# Patient Record
Sex: Female | Born: 1990
Health system: Southern US, Community
[De-identification: ages and names within clinical notes are randomized; demographics above are authoritative.]

## PROBLEM LIST (undated history)

## (undated) ENCOUNTER — Inpatient Hospital Stay (HOSPITAL_COMMUNITY): Payer: Self-pay

## (undated) DIAGNOSIS — F32A Depression, unspecified: Secondary | ICD-10-CM

## (undated) DIAGNOSIS — F329 Major depressive disorder, single episode, unspecified: Secondary | ICD-10-CM

## (undated) HISTORY — PX: TONSILLECTOMY: SUR1361

---
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
## 2009-03-19 ENCOUNTER — Inpatient Hospital Stay (HOSPITAL_COMMUNITY): Admission: AD | Admit: 2009-03-19 | Discharge: 2009-03-19 | Payer: Self-pay | Admitting: Obstetrics and Gynecology

## 2009-06-09 ENCOUNTER — Inpatient Hospital Stay (HOSPITAL_COMMUNITY): Admission: AD | Admit: 2009-06-09 | Discharge: 2009-06-12 | Payer: Self-pay | Admitting: Obstetrics & Gynecology

## 2010-05-13 ENCOUNTER — Emergency Department (HOSPITAL_COMMUNITY): Admission: EM | Admit: 2010-05-13 | Discharge: 2010-05-13 | Payer: Self-pay | Admitting: Emergency Medicine

## 2010-08-14 ENCOUNTER — Inpatient Hospital Stay (INDEPENDENT_AMBULATORY_CARE_PROVIDER_SITE_OTHER)
Admission: RE | Admit: 2010-08-14 | Discharge: 2010-08-14 | Disposition: A | Discharge status: Home / Self Care | Payer: Self-pay | Source: Ambulatory Visit | Attending: Emergency Medicine | Admitting: Emergency Medicine

## 2010-08-14 DIAGNOSIS — A749 Chlamydial infection, unspecified: Secondary | ICD-10-CM

## 2010-08-14 DIAGNOSIS — N390 Urinary tract infection, site not specified: Secondary | ICD-10-CM

## 2010-08-14 LAB — POCT URINALYSIS DIPSTICK
Nitrite: NEGATIVE
Protein, ur: 30 mg/dL — AB
Specific Gravity, Urine: >=1.03 (ref 1.005–1.030)
Urobilinogen, UA: 0.2 mg/dL (ref 0.0–1.0)
pH: 6 (ref 5.0–8.0)

## 2010-08-17 LAB — GC/CHLAMYDIA PROBE AMP, GENITAL: GC Probe Amp, Genital: NEGATIVE

## 2010-09-13 LAB — POCT URINALYSIS DIPSTICK
Bilirubin Urine: NEGATIVE
Ketones, ur: NEGATIVE mg/dL
Specific Gravity, Urine: 1.025 (ref 1.005–1.030)
Urobilinogen, UA: 0.2 mg/dL (ref 0.0–1.0)

## 2010-09-13 LAB — POCT PREGNANCY, URINE: Preg Test, Ur: NEGATIVE

## 2010-09-13 LAB — WET PREP, GENITAL

## 2010-10-02 ENCOUNTER — Inpatient Hospital Stay (INDEPENDENT_AMBULATORY_CARE_PROVIDER_SITE_OTHER)
Admission: RE | Admit: 2010-10-02 | Discharge: 2010-10-02 | Disposition: A | Discharge status: Home / Self Care | Payer: Self-pay | Source: Ambulatory Visit | Attending: Family Medicine | Admitting: Family Medicine

## 2010-10-02 DIAGNOSIS — N760 Acute vaginitis: Secondary | ICD-10-CM

## 2010-10-02 LAB — POCT URINALYSIS DIP (DEVICE)
Ketones, ur: NEGATIVE mg/dL
Specific Gravity, Urine: >=1.03 (ref 1.005–1.030)
Urobilinogen, UA: 0.2 mg/dL (ref 0.0–1.0)
pH: 6 (ref 5.0–8.0)

## 2010-10-02 LAB — WET PREP, GENITAL

## 2010-10-02 LAB — POCT PREGNANCY, URINE: Preg Test, Ur: NEGATIVE

## 2010-10-04 LAB — URINE CULTURE
Colony Count: NO GROWTH
Culture: NO GROWTH

## 2010-10-04 LAB — CBC
HCT: 25.8 % — ABNORMAL LOW (ref 36.0–46.0)
HCT: 30.5 % — ABNORMAL LOW (ref 36.0–46.0)
Hemoglobin: 10.1 g/dL — ABNORMAL LOW (ref 12.0–15.0)
Hemoglobin: 8.7 g/dL — ABNORMAL LOW (ref 12.0–15.0)
MCHC: 33.2 g/dL (ref 30.0–36.0)
MCHC: 33.6 g/dL (ref 30.0–36.0)
MCV: 88.5 fL (ref 78.0–100.0)
MCV: 88.8 fL (ref 78.0–100.0)
Platelets: 132 K/uL — ABNORMAL LOW (ref 150–400)
Platelets: 147 K/uL — ABNORMAL LOW (ref 150–400)
RBC: 2.9 MIL/uL — ABNORMAL LOW (ref 3.87–5.11)
RBC: 3.44 MIL/uL — ABNORMAL LOW (ref 3.87–5.11)
RDW: 13.5 % (ref 11.5–15.5)
RDW: 13.6 % (ref 11.5–15.5)
WBC: 9.4 K/uL (ref 4.0–10.5)
WBC: 9.4 K/uL (ref 4.0–10.5)

## 2010-10-04 LAB — GC/CHLAMYDIA PROBE AMP, GENITAL
Chlamydia, DNA Probe: NEGATIVE
GC Probe Amp, Genital: NEGATIVE

## 2010-10-04 LAB — RPR: RPR Ser Ql: NONREACTIVE

## 2010-10-07 LAB — GC/CHLAMYDIA PROBE AMP, GENITAL
Chlamydia, DNA Probe: NEGATIVE
GC Probe Amp, Genital: NEGATIVE

## 2010-10-07 LAB — WET PREP, GENITAL
Clue Cells Wet Prep HPF POC: NONE SEEN
Trich, Wet Prep: NONE SEEN

## 2010-10-07 LAB — URINALYSIS, ROUTINE W REFLEX MICROSCOPIC
Bilirubin Urine: NEGATIVE
Hgb urine dipstick: NEGATIVE
Protein, ur: NEGATIVE mg/dL
Specific Gravity, Urine: 1.02 (ref 1.005–1.030)
Urobilinogen, UA: 0.2 mg/dL (ref 0.0–1.0)

## 2010-10-07 LAB — CBC
HCT: 27 % — ABNORMAL LOW (ref 36.0–46.0)
MCV: 92.4 fL (ref 78.0–100.0)
WBC: 5.5 10*3/uL (ref 4.0–10.5)

## 2011-01-01 ENCOUNTER — Inpatient Hospital Stay (INDEPENDENT_AMBULATORY_CARE_PROVIDER_SITE_OTHER)
Admission: RE | Admit: 2011-01-01 | Discharge: 2011-01-01 | Disposition: A | Discharge status: Home / Self Care | Payer: Self-pay | Source: Ambulatory Visit | Attending: Emergency Medicine | Admitting: Emergency Medicine

## 2011-01-01 DIAGNOSIS — O2390 Unspecified genitourinary tract infection in pregnancy, unspecified trimester: Secondary | ICD-10-CM

## 2011-01-01 DIAGNOSIS — Z331 Pregnant state, incidental: Secondary | ICD-10-CM

## 2011-01-01 DIAGNOSIS — N760 Acute vaginitis: Secondary | ICD-10-CM

## 2011-01-01 LAB — POCT URINALYSIS DIP (DEVICE)
Protein, ur: NEGATIVE mg/dL
Specific Gravity, Urine: 1.02 (ref 1.005–1.030)
Urobilinogen, UA: 0.2 mg/dL (ref 0.0–1.0)

## 2011-01-01 LAB — POCT PREGNANCY, URINE: Preg Test, Ur: POSITIVE

## 2011-01-01 LAB — WET PREP, GENITAL

## 2011-01-02 LAB — RPR: RPR Ser Ql: NONREACTIVE

## 2011-01-02 LAB — HIV ANTIBODY (ROUTINE TESTING W REFLEX): HIV: NONREACTIVE

## 2011-01-03 LAB — GC/CHLAMYDIA PROBE AMP, GENITAL: GC Probe Amp, Genital: POSITIVE — AB

## 2011-01-17 ENCOUNTER — Inpatient Hospital Stay (HOSPITAL_COMMUNITY): Payer: Self-pay

## 2011-01-17 ENCOUNTER — Inpatient Hospital Stay (HOSPITAL_COMMUNITY)
Admission: AD | Admit: 2011-01-17 | Discharge: 2011-01-18 | Disposition: A | Discharge status: Home / Self Care | Payer: Self-pay | Source: Ambulatory Visit | Attending: Obstetrics & Gynecology | Admitting: Obstetrics & Gynecology

## 2011-01-17 ENCOUNTER — Encounter (HOSPITAL_COMMUNITY): Payer: Self-pay | Admitting: *Deleted

## 2011-01-17 DIAGNOSIS — Z3490 Encounter for supervision of normal pregnancy, unspecified, unspecified trimester: Secondary | ICD-10-CM

## 2011-01-17 DIAGNOSIS — R1032 Left lower quadrant pain: Secondary | ICD-10-CM | POA: Insufficient documentation

## 2011-01-17 DIAGNOSIS — O98819 Other maternal infectious and parasitic diseases complicating pregnancy, unspecified trimester: Secondary | ICD-10-CM | POA: Insufficient documentation

## 2011-01-17 DIAGNOSIS — A5901 Trichomonal vulvovaginitis: Secondary | ICD-10-CM | POA: Insufficient documentation

## 2011-01-17 HISTORY — DX: Depression, unspecified: F32.A

## 2011-01-17 HISTORY — DX: Major depressive disorder, single episode, unspecified: F32.9

## 2011-01-17 LAB — WET PREP, GENITAL
Clue Cells Wet Prep HPF POC: NONE SEEN
Trich, Wet Prep: NONE SEEN

## 2011-01-17 LAB — CBC
HCT: 33 % — ABNORMAL LOW (ref 36.0–46.0)
MCH: 30.2 pg (ref 26.0–34.0)
MCHC: 34.2 g/dL (ref 30.0–36.0)
MCV: 88.2 fL (ref 78.0–100.0)
RDW: 13.3 % (ref 11.5–15.5)

## 2011-01-17 LAB — URINALYSIS, ROUTINE W REFLEX MICROSCOPIC
Bilirubin Urine: NEGATIVE
Hgb urine dipstick: NEGATIVE
Ketones, ur: 15 mg/dL — AB
Protein, ur: NEGATIVE mg/dL
Urobilinogen, UA: 0.2 mg/dL (ref 0.0–1.0)

## 2011-01-17 LAB — POCT PREGNANCY, URINE: Preg Test, Ur: POSITIVE

## 2011-01-17 LAB — URINE MICROSCOPIC-ADD ON

## 2011-01-17 MED ORDER — METRONIDAZOLE 500 MG PO TABS: 500.0000 mg | ORAL_TABLET | Freq: Two times a day (BID) | ORAL | Status: AC

## 2011-01-17 MED ORDER — COMPLETENATE 29-1 MG PO CHEW: 1.0000 | CHEWABLE_TABLET | Freq: Every day | ORAL | Status: DC

## 2011-01-17 NOTE — ED Provider Notes (Signed)
Chief Complaint  Patient presents with  . Abdominal Pain   S: 20 y.o.G2P1001  byPatient's last menstrual period was 11/12/2010. presents with 1.5 month hx of intermittent LLQ abd pain.The pain is like contraction pain and radiates throughout upper and lower abd before it settles in LLQ.  Not having the pain now. Also gives 1 wk hx of brown vag discharge last noted yesterday. Seen at [facility] for this 01/01/11 and had quant of [id number], no US done. Had pos GC that date and was treated. Partner treated. Last intercourse last wk.  Denies abnormal  vaginal discharge or irritation. No dysuria or hematuria. No constipation. Has a 21 yo.   @ROS @ Past Medical History  Diagnosis Date  . Depression     O:   Filed Vitals:   01/17/11 2107  BP: 105/52  Pulse: 90  Temp: 98.2 F (36.8 C)  Resp: 18   Constitutional: WN/WD in NAD  ABD: soft, NT, no masses Pelvic:External genitalia: normal; BUS neg             Spec:White discharge, no blood.                       Cx nulliparous, no lesions, appears closed           Bimanual: Cx closed, long; no CMT                             Uterus anteverted, NT, 8-10 wk size                             Adnexae non tenderness, no masses  Results for orders placed during the hospital encounter of 01/17/11 (from the past 24 hour(s))  POCT PREGNANCY, URINE     Status: Normal   Collection Time   01/17/11  9:02 PM      Component Value Range   Preg Test, Ur POSITIVE     Results for orders placed during the hospital encounter of 01/17/11 (from the past 24 hour(s))  URINALYSIS, ROUTINE W REFLEX MICROSCOPIC     Status: Abnormal   Collection Time   01/17/11  9:01 PM      Component Value Range   Color, Urine YELLOW  YELLOW    Appearance CLEAR  CLEAR    Specific Gravity, Urine >1.030 (*) 1.005 - 1.030    pH 6.5  5.0 - 8.0    Glucose, UA NEGATIVE  NEGATIVE (mg/dL)   Hgb urine dipstick NEGATIVE  NEGATIVE    Bilirubin Urine NEGATIVE  NEGATIVE    Ketones, ur 15  (*) NEGATIVE (mg/dL)   Protein, ur NEGATIVE  NEGATIVE (mg/dL)   Urobilinogen, UA 0.2  0.0 - 1.0 (mg/dL)   Nitrite NEGATIVE  NEGATIVE    Leukocytes, UA SMALL (*) NEGATIVE   URINE MICROSCOPIC-ADD ON     Status: Abnormal   Collection Time   01/17/11  9:01 PM      Component Value Range   Squamous Epithelial / LPF RARE  RARE    WBC, UA 11-20  <3 (WBC/hpf)   RBC / HPF 0-2  <3 (RBC/hpf)   Bacteria, UA FEW (*) RARE    Urine-Other TRICHOMONAS PRESENT    POCT PREGNANCY, URINE     Status: Normal   Collection Time   01/17/11  9:02 PM      Component Value Range  Preg Test, Ur POSITIVE    CBC     Status: Abnormal   Collection Time   01/17/11  9:40 PM      Component Value Range   WBC 4.8  4.0 - 10.5 (K/uL)   RBC 3.74 (*) 3.87 - 5.11 (MIL/uL)   Hemoglobin 11.3 (*) 12.0 - 15.0 (g/dL)   HCT 16.1 (*) 09.6 - 46.0 (%)   MCV 88.2  78.0 - 100.0 (fL)   MCH 30.2  26.0 - 34.0 (pg)   MCHC 34.2  30.0 - 36.0 (g/dL)   RDW 04.5  40.9 - 81.1 (%)   Platelets 155  150 - 400 (K/uL)  ABO/RH     Status: Normal   Collection Time   01/17/11  9:40 PM      Component Value Range   ABO/RH(D) B POS    WET PREP, GENITAL     Status: Abnormal   Collection Time   01/17/11  9:48 PM      Component Value Range   Yeast, Wet Prep NONE SEEN  NONE SEEN    Trich, Wet Prep NONE SEEN  NONE SEEN    Clue Cells, Wet Prep NONE SEEN  NONE SEEN    WBC, Wet Prep HPF POC MODERATE (*) NONE SEEN      Ultrasound: *RADIOLOGY REPORT*  Clinical Data: 20 year old female with left lower quadrant  abdominal pain.  Estimated gestational age 63-week 3 days by LMP.  OBSTETRIC <14 WK ULTRASOUND  Technique: Transabdominal ultrasound was performed for evaluation  of the gestation as well as the maternal uterus and adnexal  regions.  Comparison: None relevant.  Intrauterine gestational sac: Single.  Embryo: Visible.  Cardiac Activity: Detected.  Heart Rate: 176. bpm  CRL: 3.2 cm 10w 1.d [place] EDC: 08/14/2011.  Maternal uterus/Adnexae:  No  subchorionic hemorrhage or pelvic free fluid. Normal right  ovary measuring 3.2 x 1.9 x 1.9 cm. Left ovary contains a simple  appearing cystic structure measuring 4 cm in diameter. No  associated vascularity. Left adnexa overall measures 6.1 x 3.7 x  4.8 cm.  IMPRESSION:  Viable singleton intrauterine pregnancy with estimated gestational  age of [redacted] weeks and 1 days by crown-rump length. No subchorionic  hemorrhage or pelvic free fluid.  Original Report Authenticated By: Harley Hallmark, M.D.      Imaging      A:Viable IUP 10+wk. Trich. LLQ pain of unclear etiology  P:Flagyl, PNVs, start PNC. Reassured re pain. WIll call if GC/CT pos.

## 2011-01-17 NOTE — Progress Notes (Signed)
C/o L sided lower abdominal pain for past 2 months; pain has progressively gotten worse;

## 2011-01-17 NOTE — Progress Notes (Signed)
Pt G2 P1, LMP 11/12/2010, +UPT at Urgent Care.  Pt having abd pain that comes and goes x 1.34mths.  Pain is at times greater on left side.  Pt denies bleeding.

## 2011-04-15 ENCOUNTER — Emergency Department (HOSPITAL_COMMUNITY)
Admission: EM | Admit: 2011-04-15 | Discharge: 2011-04-15 | Disposition: A | Discharge status: Home / Self Care | Payer: No Typology Code available for payment source | Attending: Emergency Medicine | Admitting: Emergency Medicine

## 2011-04-15 DIAGNOSIS — T148XXA Other injury of unspecified body region, initial encounter: Secondary | ICD-10-CM | POA: Insufficient documentation

## 2011-04-15 DIAGNOSIS — M545 Low back pain, unspecified: Secondary | ICD-10-CM | POA: Insufficient documentation

## 2011-04-15 DIAGNOSIS — M25569 Pain in unspecified knee: Secondary | ICD-10-CM | POA: Insufficient documentation

## 2011-05-30 ENCOUNTER — Encounter (HOSPITAL_COMMUNITY): Payer: Self-pay | Admitting: *Deleted

## 2011-05-30 ENCOUNTER — Emergency Department (HOSPITAL_COMMUNITY)
Admission: EM | Admit: 2011-05-30 | Discharge: 2011-05-31 | Disposition: A | Discharge status: Home / Self Care | Payer: Self-pay | Attending: Emergency Medicine | Admitting: Emergency Medicine

## 2011-05-30 DIAGNOSIS — R509 Fever, unspecified: Secondary | ICD-10-CM | POA: Insufficient documentation

## 2011-05-30 DIAGNOSIS — R42 Dizziness and giddiness: Secondary | ICD-10-CM | POA: Insufficient documentation

## 2011-05-30 DIAGNOSIS — R070 Pain in throat: Secondary | ICD-10-CM | POA: Insufficient documentation

## 2011-05-30 DIAGNOSIS — J039 Acute tonsillitis, unspecified: Secondary | ICD-10-CM | POA: Insufficient documentation

## 2011-05-30 DIAGNOSIS — F3289 Other specified depressive episodes: Secondary | ICD-10-CM | POA: Insufficient documentation

## 2011-05-30 DIAGNOSIS — F329 Major depressive disorder, single episode, unspecified: Secondary | ICD-10-CM | POA: Insufficient documentation

## 2011-05-30 DIAGNOSIS — J3489 Other specified disorders of nose and nasal sinuses: Secondary | ICD-10-CM | POA: Insufficient documentation

## 2011-05-30 DIAGNOSIS — R599 Enlarged lymph nodes, unspecified: Secondary | ICD-10-CM | POA: Insufficient documentation

## 2011-05-30 DIAGNOSIS — R630 Anorexia: Secondary | ICD-10-CM | POA: Insufficient documentation

## 2011-05-30 NOTE — ED Notes (Signed)
Pt c/o a sorethroat and dizziness for 2 days. With an elevated temp

## 2011-05-31 MED ORDER — SODIUM CHLORIDE 0.9 % IV BOLUS (SEPSIS)
1000.0000 mL | Freq: Once | INTRAVENOUS | Status: AC
  Administered 2011-05-31: 1000 mL via INTRAVENOUS

## 2011-05-31 MED ORDER — OXYCODONE-ACETAMINOPHEN 5-325 MG PO TABS: 1.0000 | ORAL_TABLET | ORAL | Status: AC | PRN

## 2011-05-31 MED ORDER — DEXAMETHASONE SODIUM PHOSPHATE 10 MG/ML IJ SOLN
10.0000 mg | Freq: Once | INTRAMUSCULAR | Status: AC
  Administered 2011-05-31: 10 mg via INTRAVENOUS
  Filled 2011-05-31: qty 1

## 2011-05-31 NOTE — ED Notes (Signed)
Patient states she thinks she has strep throat. Has not eaten or drank in two days. Has previous history. Rates pain as 10/10

## 2011-05-31 NOTE — ED Provider Notes (Signed)
History     CSN: [id number] Arrival date & time: 05/30/2011 10:47 PM   First MD Initiated Contact with Patient 05/31/11 0315      Chief Complaint  Patient presents with  . Dizziness    (Consider location/radiation/quality/duration/timing/severity/associated sxs/prior treatment) The history is provided by the patient.   20 year old female who has had a sore throat for the last 2 days. Pain is described as severe and rated at 10 out of 10. It is worse when she swallows. She tried to take some Alka-Seltzer to help her symptoms, but the Alka-Seltzer made it worse. She is on a fever with temperatures as high as 102.2. She's had minimal rhinorrhea and no cough. There's been no vomiting or diarrhea. She's not had any arthralgias or myalgias. She's had some mild dizziness. She states she's not been able to eat or drink anything for the last 2 days. She has had strep throats in the past. Current symptoms are the same as what she had with previous episodes of strep throat.  Past Medical History  Diagnosis Date  . Depression     Past Surgical History  Procedure Date  . Tonsillectomy     History reviewed. No pertinent family history.  History  Substance Use Topics  . Smoking status: Current Everyday Smoker  . Smokeless tobacco: Not on file  . Alcohol Use: No    OB History    Grav Para Term Preterm Abortions TAB SAB Ect Mult Living   2 1 1       1       Review of Systems  All other systems reviewed and are negative.    Allergies  Review of patient's allergies indicates no known allergies.  Home Medications   Current Outpatient Rx  Name Route Sig Dispense Refill  . THERA M PLUS PO TABS Oral Take 1 tablet by mouth daily.        BP 101/64  Pulse 116  Temp(Src) 99.3 F (37.4 C) (Oral)  Resp 22  SpO2 100%  LMP 05/02/2011  Breastfeeding? Unknown  Physical Exam  Nursing note and vitals reviewed.  20 year old female who is resting comfortably and in no acute distress.  Vital signs show mild tachypnea with rate respiratory rate of 22 and mild tachycardia with a heart rate of 116. Head is normocephalic and atraumatic. PERRLA, EOMI. Oropharynx shows tonsillar erythema with exudate present bilaterally. She's not having any difficulty with her secretions and she has normal phonation. Neck is supple with anterior and posterior cervical lymphadenopathy bilaterally. Back is nontender. Lungs are clear without any rales, wheezes, rhonchi. Heart has regular rate and rhythm without murmur. Abdomen is soft, flat, nontender without masses or hepatosplenomegaly. Extremities no cyanosis or edema, full range of motion present. Neurologic: Mental status is normal, cranial nerves are intact, there no motor or sensory deficits. Psychiatric: No abnormalities of mood or affect.  ED Course  Procedures (including critical care time)   Labs Reviewed  RAPID STREP SCREEN  STREP A DNA PROBE   No results found. Results for orders placed during the hospital encounter of 05/30/11  RAPID STREP SCREEN      Component Value Range   Streptococcus, Group A Screen (Direct) NEGATIVE  NEGATIVE    No results found.    No diagnosis found.    MDM  Strep screen is negative but she still may have streptococcal pharyngitis. Streptococcal DNA probe is sent and she is given normal saline and dexamethasone for symptomatic relief.  Dione Booze, MD 05/31/11 [phone]

## 2011-06-01 LAB — STREP A DNA PROBE: Group A Strep Probe: NEGATIVE

## 2011-11-09 ENCOUNTER — Encounter (HOSPITAL_COMMUNITY): Payer: Self-pay

## 2011-11-09 ENCOUNTER — Emergency Department (INDEPENDENT_AMBULATORY_CARE_PROVIDER_SITE_OTHER)
Admission: EM | Admit: 2011-11-09 | Discharge: 2011-11-09 | Disposition: A | Discharge status: Home / Self Care | Payer: Self-pay | Source: Home / Self Care | Attending: Emergency Medicine | Admitting: Emergency Medicine

## 2011-11-09 DIAGNOSIS — N760 Acute vaginitis: Secondary | ICD-10-CM

## 2011-11-09 LAB — POCT URINALYSIS DIP (DEVICE)
Leukocytes, UA: NEGATIVE
Protein, ur: NEGATIVE mg/dL
Urobilinogen, UA: 0.2 mg/dL (ref 0.0–1.0)
pH: 6 (ref 5.0–8.0)

## 2011-11-09 LAB — POCT PREGNANCY, URINE: Preg Test, Ur: NEGATIVE

## 2011-11-09 LAB — WET PREP, GENITAL
Clue Cells Wet Prep HPF POC: NONE SEEN
Yeast Wet Prep HPF POC: NONE SEEN

## 2011-11-09 MED ORDER — METRONIDAZOLE 500 MG PO TABS: 500.0000 mg | ORAL_TABLET | Freq: Two times a day (BID) | ORAL | Status: AC

## 2011-11-09 NOTE — Discharge Instructions (Signed)
We will contact you. ONLY if any abnormal test results will require further treatment  Bacterial Vaginosis Bacterial vaginosis (BV) is a vaginal infection where the normal balance of bacteria in the vagina is disrupted. The normal balance is then replaced by an overgrowth of certain bacteria. There are several different kinds of bacteria that can cause BV. BV is the most common vaginal infection in women of childbearing age. CAUSES   The cause of BV is not fully understood. BV develops when there is an increase or imbalance of harmful bacteria.   Some activities or behaviors can upset the normal balance of bacteria in the vagina and put women at increased risk including:   Having a new sex partner or multiple sex partners.   Douching.   Using an intrauterine device (IUD) for contraception.   It is not clear what role sexual activity plays in the development of BV. However, women that have never had sexual intercourse are rarely infected with BV.  Women do not get BV from toilet seats, bedding, swimming pools or from touching objects around them.  SYMPTOMS   Grey vaginal discharge.   A fish-like odor with discharge, especially after sexual intercourse.   Itching or burning of the vagina and vulva.   Burning or pain with urination.   Some women have no signs or symptoms at all.  DIAGNOSIS  Your caregiver must examine the vagina for signs of BV. Your caregiver will perform lab tests and look at the sample of vaginal fluid through a microscope. They will look for bacteria and abnormal cells (clue cells), a pH test higher than 4.5, and a positive amine test all associated with BV.  RISKS AND COMPLICATIONS   Pelvic inflammatory disease (PID).   Infections following gynecology surgery.   Developing HIV.   Developing herpes virus.  TREATMENT  Sometimes BV will clear up without treatment. However, all women with symptoms of BV should be treated to avoid complications, especially if  gynecology surgery is planned. Female partners generally do not need to be treated. However, BV may spread between female sex partners so treatment is helpful in preventing a recurrence of BV.   BV may be treated with antibiotics. The antibiotics come in either pill or vaginal cream forms. Either can be used with nonpregnant or pregnant women, but the recommended dosages differ. These antibiotics are not harmful to the baby.   BV can recur after treatment. If this happens, a second round of antibiotics will often be prescribed.   Treatment is important for pregnant women. If not treated, BV can cause a premature delivery, especially for a pregnant woman who had a premature birth in the past. All pregnant women who have symptoms of BV should be checked and treated.   For chronic reoccurrence of BV, treatment with a type of prescribed gel vaginally twice a week is helpful.  HOME CARE INSTRUCTIONS   Finish all medication as directed by your caregiver.   Do not have sex until treatment is completed.   Tell your sexual partner that you have a vaginal infection. They should see their caregiver and be treated if they have problems, such as a mild rash or itching.   Practice safe sex. Use condoms. Only have 1 sex partner.  PREVENTION  Basic prevention steps can help reduce the risk of upsetting the natural balance of bacteria in the vagina and developing BV:  Do not have sexual intercourse (be abstinent).   Do not douche.   Use all of  the medicine prescribed for treatment of BV, even if the signs and symptoms go away.   Tell your sex partner if you have BV. That way, they can be treated, if needed, to prevent reoccurrence.  SEEK MEDICAL CARE IF:   Your symptoms are not improving after 3 days of treatment.   You have increased discharge, pain, or fever.  MAKE SURE YOU:   Understand these instructions.   Will watch your condition.   Will get help right away if you are not doing well or get  worse.  FOR MORE INFORMATION  Division of STD Prevention (DSTDP), Centers for Disease Control and Prevention: [employer] American Social Health Association (ASHA): www.ashastd.org  Document Released: 06/19/2005 Document Revised: 06/08/2011 Document Reviewed: 12/10/2008 [facility] Patient Information 2012 [place], [place].

## 2011-11-09 NOTE — ED Notes (Signed)
C/o vaginal discomfort, discharge, odor and mild itching for 1 month.

## 2011-11-09 NOTE — ED Provider Notes (Signed)
History     CSN: [id number]  Arrival date & time 11/09/11  1005   First MD Initiated Contact with Patient 11/09/11 1039      Chief Complaint  Patient presents with  . Vaginal Discharge    (Consider location/radiation/quality/duration/timing/severity/associated sxs/prior treatment) HPI Comments: ODOR, WHITISH DISCHARGE FOR ONE MONTH, HAS NO CONCERNS FOR STD EXPOSURE, DENIES, URINARY SYMPTOMS  Patient is a 21 y.o. female presenting with vaginal discharge. The history is provided by the patient.  Vaginal Discharge This is a new problem. The current episode started more than 1 week ago. The problem occurs constantly. The problem has not changed since onset.Pertinent negatives include no abdominal pain, no headaches and no shortness of breath. The symptoms are aggravated by nothing. The symptoms are relieved by nothing. She has tried nothing for the symptoms.    Past Medical History  Diagnosis Date  . Depression   . Genital herpes     Past Surgical History  Procedure Date  . Tonsillectomy     No family history on file.  History  Substance Use Topics  . Smoking status: Current Everyday Smoker  . Smokeless tobacco: Not on file  . Alcohol Use: Yes    OB History    Grav Para Term Preterm Abortions TAB SAB Ect Mult Living   2 1 1       1       Review of Systems  Constitutional: Negative for fever and chills.  Respiratory: Negative for shortness of breath.   Gastrointestinal: Negative for abdominal pain.  Genitourinary: Positive for vaginal discharge. Negative for dysuria, frequency, flank pain, genital sores and vaginal pain.  Neurological: Negative for headaches.    Allergies  Review of patient's allergies indicates no known allergies.  Home Medications   Current Outpatient Rx  Name Route Sig Dispense Refill  . METRONIDAZOLE 500 MG PO TABS Oral Take 1 tablet (500 mg total) by mouth 2 (two) times daily. 14 tablet 0  . THERA M PLUS PO TABS Oral Take 1 tablet by  mouth daily.        BP 113/59  Pulse 80  Temp(Src) 98 F (36.7 C) (Oral)  Resp 16  SpO2 100%  Physical Exam  Nursing note and vitals reviewed. Constitutional: She appears well-developed and well-nourished.  HENT:  Mouth/Throat: No oropharyngeal exudate.  Eyes: Conjunctivae are normal.  Pulmonary/Chest: Effort normal and breath sounds normal.  Abdominal: Soft. She exhibits no shifting dullness and no distension. There is no tenderness. There is no rebound.  Genitourinary: No erythema or bleeding around the vagina. No foreign body around the vagina. Vaginal discharge found.  Skin: No rash noted.    ED Course  Procedures (including critical care time)  Labs Reviewed  WET PREP, GENITAL - Abnormal; Notable for the following:    Trich, Wet Prep TOO NUMEROUS TO COUNT (*)    WBC, Wet Prep HPF POC FEW (*)    All other components within normal limits  POCT URINALYSIS DIP (DEVICE)  POCT PREGNANCY, URINE  GC/CHLAMYDIA PROBE AMP, GENITAL   No results found.   1. Vaginitis       MDM  Uncomplicated vaginitis-clinial impression BV. TD screening        Jimmie Molly, MD 11/09/11 1456

## 2011-11-10 ENCOUNTER — Telehealth (HOSPITAL_COMMUNITY): Payer: Self-pay | Admitting: *Deleted

## 2011-11-10 LAB — GC/CHLAMYDIA PROBE AMP, GENITAL
Chlamydia, DNA Probe: POSITIVE — AB
GC Probe Amp, Genital: NEGATIVE

## 2011-11-10 NOTE — ED Notes (Signed)
GC neg., Chlamydia pos., Wet prep: Trich TNTC, and few WBC's.  Pt. adequately treated with Flagyl.  Needs Rx. for Chlamydia.  Labs shown to Dr. Chaney Malling and she gave order for Zithromax 1 gm po x 1 dose no refills. I called pt., but number was incorrect. I called contact-mother Karen Mcbride and left message to call. Call 1. Karen Mcbride 11/10/2011

## 2011-11-13 ENCOUNTER — Telehealth (HOSPITAL_COMMUNITY): Payer: Self-pay | Admitting: *Deleted

## 2011-11-14 ENCOUNTER — Telehealth (HOSPITAL_COMMUNITY): Payer: Self-pay | Admitting: *Deleted

## 2011-11-14 NOTE — ED Notes (Signed)
Pt. Returned my call on VM @ 1142. I called pt. back. Pt. verified x 2 and given results. Pt. Told she was adequately treated with Flagyl for Trich but needs Zithromax for Chlamydia. You need to notify your partner to be treated with Flagyl for Trich and tx. For Chlamydia as well, no sex until you have finished your medication and your partner has been treated and to practice safe sex. You can get HIV testing at the [facility] STD clinic.  Pt. Voiced understanding and wants Rx. called to [facility] on [address].  Rx. called to pharmacist.  DHHS form completed and faxed to the [facility]. Vassie Moselle 11/14/2011

## 2011-11-25 ENCOUNTER — Emergency Department (HOSPITAL_COMMUNITY)
Admission: EM | Admit: 2011-11-25 | Discharge: 2011-11-25 | Discharge status: Absent without leave | Payer: Self-pay | Source: Home / Self Care | Attending: Emergency Medicine | Admitting: Emergency Medicine

## 2011-11-26 ENCOUNTER — Emergency Department (HOSPITAL_COMMUNITY)
Admission: EM | Admit: 2011-11-26 | Discharge: 2011-11-26 | Disposition: A | Discharge status: Home / Self Care | Payer: Self-pay | Attending: Emergency Medicine | Admitting: Emergency Medicine

## 2011-11-26 ENCOUNTER — Encounter (HOSPITAL_COMMUNITY): Payer: Self-pay | Admitting: Emergency Medicine

## 2011-11-26 DIAGNOSIS — B373 Candidiasis of vulva and vagina: Secondary | ICD-10-CM

## 2011-11-26 DIAGNOSIS — B3731 Acute candidiasis of vulva and vagina: Secondary | ICD-10-CM | POA: Insufficient documentation

## 2011-11-26 DIAGNOSIS — F17200 Nicotine dependence, unspecified, uncomplicated: Secondary | ICD-10-CM | POA: Insufficient documentation

## 2011-11-26 LAB — WET PREP, GENITAL
Clue Cells Wet Prep HPF POC: NONE SEEN
Trich, Wet Prep: NONE SEEN

## 2011-11-26 LAB — URINALYSIS, ROUTINE W REFLEX MICROSCOPIC
Bilirubin Urine: NEGATIVE
Hgb urine dipstick: NEGATIVE
Ketones, ur: NEGATIVE mg/dL
Nitrite: NEGATIVE
Specific Gravity, Urine: 1.026 (ref 1.005–1.030)
Urobilinogen, UA: 0.2 mg/dL (ref 0.0–1.0)

## 2011-11-26 LAB — URINE MICROSCOPIC-ADD ON

## 2011-11-26 LAB — POCT PREGNANCY, URINE: Preg Test, Ur: NEGATIVE

## 2011-11-26 MED ORDER — FLUCONAZOLE 150 MG PO TABS
150.0000 mg | ORAL_TABLET | Freq: Once | ORAL | Status: AC
  Administered 2011-11-26: 150 mg via ORAL
  Filled 2011-11-26: qty 1

## 2011-11-26 NOTE — ED Notes (Signed)
Discharge inst given to patient. Voiced understanding. 

## 2011-11-26 NOTE — ED Notes (Signed)
Patient presents stating 5-6 days ago she started with pelvic pain  Tonight when she urinated, it burned and she noticed irritation.  Has a history of genital herpes and is unsure if this is an outbreak.

## 2011-11-26 NOTE — ED Provider Notes (Cosign Needed)
History     CSN: [id number]  Arrival date & time 11/26/11  0254   First MD Initiated Contact with Patient 11/26/11 0319      Chief Complaint  Patient presents with  . Vaginal Discharge    (Consider location/radiation/quality/duration/timing/severity/associated sxs/prior treatment) Patient is a 21 y.o. female presenting with vaginal discharge. The history is provided by the patient and medical records.  Vaginal Discharge Pertinent negatives include no abdominal pain and no headaches.   the patient is a 21 year old, female, who complains of vaginal discharge  With itching and skin irritation.  The past 5 days.  She denies nausea, vomiting, fevers, chills, abdominal pain.  She states that she had her last menstrual period on May 18.  He was normal except for being neither than usual.  She also states that she was recently on antibiotics for treatment of chlamydia.  She has a single sexual partner, who is the father of her child.  Past Medical History  Diagnosis Date  . Depression   . Genital herpes     Past Surgical History  Procedure Date  . Tonsillectomy     No family history on file.  History  Substance Use Topics  . Smoking status: Current Everyday Smoker -- 0.5 packs/day  . Smokeless tobacco: Not on file  . Alcohol Use: Yes     occasion    OB History    Grav Para Term Preterm Abortions TAB SAB Ect Mult Living   2 1 1       1       Review of Systems  Constitutional: Negative for fever and chills.  Gastrointestinal: Negative for nausea, vomiting and abdominal pain.  Genitourinary: Positive for vaginal discharge. Negative for dysuria.  Skin: Negative for rash.  Neurological: Negative for headaches.  Psychiatric/Behavioral: Negative for confusion.  All other systems reviewed and are negative.    Allergies  Review of patient's allergies indicates no known allergies.  Home Medications   Current Outpatient Rx  Name Route Sig Dispense Refill  . IBUPROFEN 200  MG PO TABS Oral Take 400 mg by mouth every 6 (six) hours as needed. For pain    . THERA M PLUS PO TABS Oral Take 1 tablet by mouth daily.        BP 117/68  Pulse 88  Temp(Src) 97.9 F (36.6 C) (Oral)  Resp 16  SpO2 100%  LMP 11/18/2011  Physical Exam  Nursing note and vitals reviewed. Constitutional: She is oriented to person, place, and time. She appears well-developed and well-nourished.  HENT:  Head: Normocephalic and atraumatic.  Eyes: Conjunctivae are normal.  Neck: Normal range of motion.  Cardiovascular: Normal rate.   No murmur heard. Pulmonary/Chest: Effort normal. No respiratory distress.  Abdominal: Soft. She exhibits no distension. There is no tenderness.  Genitourinary: Vaginal discharge found.       Pelvic done with female chaperone  No lesions on skin or in vagina Cervix, pink, closed Mild white thick discharge No odor No cmt No adnexal masses  Musculoskeletal: Normal range of motion.  Neurological: She is alert and oriented to person, place, and time.  Skin: Skin is warm and dry.  Psychiatric: She has a normal mood and affect. Thought content normal.    ED Course  Procedures (including critical care time)   Labs Reviewed  URINALYSIS, ROUTINE W REFLEX MICROSCOPIC  PROCALCITONIN  GC/CHLAMYDIA PROBE AMP, GENITAL  WET PREP, GENITAL   No results found.   No diagnosis found.    MDM  Vaginal discharge Vaginal candidiasis No evidence std/pid        Cheri Guppy, MD 11/26/11 0981  Cheri Guppy, MD 11/26/11 [phone]

## 2011-11-26 NOTE — Discharge Instructions (Signed)
You have evidence of a fungal infection which is common following a menstrual cycle or antibiotic use.  You do NOT have signs of a sexually transmitted disease.  We have treated you with diflucan in the ED. You do not need more medicine for the infection.  Use vaseline or another topical medicine for the irritation.   Follow up with your doctor as needed.

## 2011-11-26 NOTE — ED Notes (Signed)
Patient is resting comfortably. 

## 2011-11-26 NOTE — ED Notes (Addendum)
Pt reports having pelvic pain for 5-6 days, and vaginal discomfort; dysuria, abnormal discharge; per pt, hx genital herpes- unable to get valtrex prescription d/t insurance; thinks she might be having a break out

## 2011-11-27 LAB — GC/CHLAMYDIA PROBE AMP, GENITAL
Chlamydia, DNA Probe: NEGATIVE
GC Probe Amp, Genital: NEGATIVE

## 2012-02-06 ENCOUNTER — Encounter (HOSPITAL_COMMUNITY): Payer: Self-pay | Admitting: Emergency Medicine

## 2012-02-06 ENCOUNTER — Emergency Department (HOSPITAL_COMMUNITY)
Admission: EM | Admit: 2012-02-06 | Discharge: 2012-02-06 | Discharge status: Against Medical Advice | Payer: Self-pay | Attending: Emergency Medicine | Admitting: Emergency Medicine

## 2012-02-06 DIAGNOSIS — R21 Rash and other nonspecific skin eruption: Secondary | ICD-10-CM | POA: Insufficient documentation

## 2012-02-06 DIAGNOSIS — N898 Other specified noninflammatory disorders of vagina: Secondary | ICD-10-CM | POA: Insufficient documentation

## 2012-02-06 LAB — URINALYSIS, ROUTINE W REFLEX MICROSCOPIC
Glucose, UA: NEGATIVE mg/dL
Leukocytes, UA: NEGATIVE
Nitrite: NEGATIVE
Specific Gravity, Urine: 1.027 (ref 1.005–1.030)
pH: 6 (ref 5.0–8.0)

## 2012-02-06 NOTE — ED Notes (Signed)
Called for treatment room with no answer 

## 2012-02-06 NOTE — ED Notes (Signed)
Called for recheck of vitals, no response.

## 2012-02-06 NOTE — ED Notes (Signed)
Called to recheck vitals, pt did not respond

## 2012-02-06 NOTE — ED Notes (Signed)
Pt c/o rash to arms x 1 month; pt sts foul smelling vaginal discharge x 4 days; pt sts hx of unprotected sex

## 2012-02-06 NOTE — ED Notes (Signed)
advised

## 2012-03-23 ENCOUNTER — Encounter (HOSPITAL_COMMUNITY): Payer: Self-pay | Admitting: *Deleted

## 2012-03-23 DIAGNOSIS — O2690 Pregnancy related conditions, unspecified, unspecified trimester: Secondary | ICD-10-CM | POA: Insufficient documentation

## 2012-03-23 DIAGNOSIS — R52 Pain, unspecified: Secondary | ICD-10-CM | POA: Insufficient documentation

## 2012-03-23 LAB — URINALYSIS, ROUTINE W REFLEX MICROSCOPIC
Leukocytes, UA: NEGATIVE
Nitrite: NEGATIVE
Protein, ur: NEGATIVE mg/dL
Specific Gravity, Urine: 1.037 — ABNORMAL HIGH (ref 1.005–1.030)
Urobilinogen, UA: 1 mg/dL (ref 0.0–1.0)

## 2012-03-23 LAB — PREGNANCY, URINE: Preg Test, Ur: POSITIVE — AB

## 2012-03-23 NOTE — ED Notes (Signed)
abd pain since last weekend  With constipation and nausea.  lmp  aug

## 2012-03-24 ENCOUNTER — Emergency Department (HOSPITAL_COMMUNITY): Payer: Self-pay

## 2012-03-24 ENCOUNTER — Emergency Department (HOSPITAL_COMMUNITY)
Admission: EM | Admit: 2012-03-24 | Discharge: 2012-03-24 | Disposition: A | Discharge status: Home / Self Care | Payer: Self-pay | Attending: Emergency Medicine | Admitting: Emergency Medicine

## 2012-03-24 DIAGNOSIS — Z3490 Encounter for supervision of normal pregnancy, unspecified, unspecified trimester: Secondary | ICD-10-CM

## 2012-03-24 DIAGNOSIS — R109 Unspecified abdominal pain: Secondary | ICD-10-CM

## 2012-03-24 LAB — COMPREHENSIVE METABOLIC PANEL
ALT: 8 U/L (ref 0–35)
AST: 16 U/L (ref 0–37)
Albumin: 4 g/dL (ref 3.5–5.2)
Chloride: 104 mEq/L (ref 96–112)
Creatinine, Ser: 0.77 mg/dL (ref 0.50–1.10)
Sodium: 138 mEq/L (ref 135–145)
Total Bilirubin: 0.2 mg/dL — ABNORMAL LOW (ref 0.3–1.2)

## 2012-03-24 LAB — WET PREP, GENITAL
Trich, Wet Prep: NONE SEEN
Yeast Wet Prep HPF POC: NONE SEEN

## 2012-03-24 LAB — CBC WITH DIFFERENTIAL/PLATELET
Basophils Absolute: 0 10*3/uL (ref 0.0–0.1)
Basophils Relative: 1 % (ref 0–1)
MCHC: 34.5 g/dL (ref 30.0–36.0)
Monocytes Absolute: 0.4 10*3/uL (ref 0.1–1.0)
Neutro Abs: 2.5 10*3/uL (ref 1.7–7.7)
Neutrophils Relative %: 50 % (ref 43–77)
Platelets: 204 10*3/uL (ref 150–400)
RDW: 13.4 % (ref 11.5–15.5)
WBC: 5.1 10*3/uL (ref 4.0–10.5)

## 2012-03-24 MED ORDER — ONDANSETRON HCL 4 MG PO TABS: 4.0000 mg | ORAL_TABLET | Freq: Four times a day (QID) | ORAL | Status: DC

## 2012-03-24 NOTE — ED Notes (Signed)
Pt. Returned from ultrasound

## 2012-03-24 NOTE — ED Notes (Signed)
Pt. Reports lower abdominal pain and left flank pain starting last weekend. Denies pain/burning with urination but reports urinary frequency. Pt. States spotting on Monday, only used 1 pad, no other bleeding noted. Pt. Reports foul-smelling vaginal discharge, was treated for STD in May.

## 2012-03-24 NOTE — ED Notes (Signed)
Pt. Verbalized understanding of discharge instructions. No distress noted. Pt. Alert and oriented x4.

## 2012-03-24 NOTE — ED Notes (Signed)
Pt. To ultrasound.

## 2012-03-24 NOTE — ED Provider Notes (Signed)
History     CSN: [id number]  Arrival date & time 03/23/12  2312   First MD Initiated Contact with Patient 03/24/12 0301      Chief Complaint  Patient presents with  . Abdominal Pain    (Consider location/radiation/quality/duration/timing/severity/associated sxs/prior treatment) HPI 21 year old female presents to the emergency department complaining of left lower abdominal pain ongoing for the last week. Patient reports pain was severe at onset about a week ago, kept her in the bed. She has had nausea but no vomiting. She reports chronic constipation. LMP approximately 3-4 weeks ago, reports it was normal at that time. She is sexually active. She reports some vaginal discharge. She has had some burning with urination at times. She had some spotting one week ago. She has not had any fevers, occasionally has chills. Past Medical History  Diagnosis Date  . Depression   . Genital herpes     Past Surgical History  Procedure Date  . Tonsillectomy     No family history on file.  History  Substance Use Topics  . Smoking status: Current Every Day Smoker -- 0.5 packs/day  . Smokeless tobacco: Not on file  . Alcohol Use: Yes     occasion    OB History    Grav Para Term Preterm Abortions TAB SAB Ect Mult Living   2 1 1       1       Review of Systems  All other systems reviewed and are negative.    Allergies  Review of patient's allergies indicates no known allergies.  Home Medications  No current outpatient prescriptions on file.  BP 113/67  Pulse 80  Temp 98.7 F (37.1 C) (Oral)  Resp 16  SpO2 100%  LMP 02/21/2012  Physical Exam  Nursing note and vitals reviewed. Constitutional: She is oriented to person, place, and time. She appears well-developed and well-nourished.  HENT:  Head: Normocephalic and atraumatic.  Nose: Nose normal.  Mouth/Throat: Oropharynx is clear and moist.  Eyes: Conjunctivae normal and EOM are normal. Pupils are equal, round, and reactive  to light.  Neck: Normal range of motion. Neck supple. No JVD present. No tracheal deviation present. No thyromegaly present.  Cardiovascular: Normal rate, regular rhythm, normal heart sounds and intact distal pulses.  Exam reveals no gallop and no friction rub.   No murmur heard. Pulmonary/Chest: Effort normal and breath sounds normal. No stridor. No respiratory distress. She has no wheezes. She has no rales. She exhibits no tenderness.  Abdominal: Soft. Bowel sounds are normal. She exhibits no distension and no mass. There is no tenderness. There is no rebound and no guarding.  Genitourinary: Uterus normal. Vaginal discharge found.       Thick white discharge noted. Cervix was slight bluish tinge, no cervical motion tenderness, no right adnexal tenderness, no uterine tenderness. Tenderness with palpation of left adnexa without masses appreciated on exam  Musculoskeletal: Normal range of motion. She exhibits no edema and no tenderness.  Lymphadenopathy:    She has no cervical adenopathy.  Neurological: She is alert and oriented to person, place, and time. She exhibits normal muscle tone. Coordination normal.  Skin: Skin is dry. No rash noted. No erythema. No pallor.  Psychiatric: She has a normal mood and affect. Her behavior is normal. Judgment and thought content normal.    ED Course  Procedures (including critical care time)  Labs Reviewed  URINALYSIS, ROUTINE W REFLEX MICROSCOPIC - Abnormal; Notable for the following:    Specific Gravity, Urine  1.037 (*)     All other components within normal limits  PREGNANCY, URINE - Abnormal; Notable for the following:    Preg Test, Ur POSITIVE (*)     All other components within normal limits  COMPREHENSIVE METABOLIC PANEL - Abnormal; Notable for the following:    Glucose, Bld 103 (*)     Total Bilirubin 0.2 (*)     All other components within normal limits  WET PREP, GENITAL - Abnormal; Notable for the following:    Clue Cells Wet Prep HPF POC  FEW (*)     WBC, Wet Prep HPF POC FEW (*)     All other components within normal limits  HCG, QUANTITATIVE, PREGNANCY - Abnormal; Notable for the following:    hCG, Beta Chain, Quant, S 75 (*)     All other components within normal limits  CBC WITH DIFFERENTIAL  LIPASE, BLOOD  GC/CHLAMYDIA PROBE AMP, GENITAL   US Ob Limited  03/24/2012  *RADIOLOGY REPORT*  Clinical Data: Pain.  Constipation and nausea.  OBSTETRIC <14 WK ULTRASOUND, TRANSVAGINAL OB USTechnique: Transabdominal and transvaginal ultrasound was performed for evaluation of the gestation as well as the maternal uterus and adnexal regions.  Findings:  There is no intrauterine gestational sac.  The endometrium appears normal measuring 9.2 mm in thickness.  Maternal uterus/adnexae:  Both ovaries appear normal.  A small amount of free fluid is noted within the pelvis.  IMPRESSION:  1.  There is no evidence for intrauterine pregnancy. 2.  Small amount of free fluid within the pelvis   Original Report Authenticated By: Rosealee Albee, M.D.    US Ob Transvaginal  03/24/2012  *RADIOLOGY REPORT*  Clinical Data: Pain.  Constipation and nausea.  OBSTETRIC <14 WK ULTRASOUND, TRANSVAGINAL OB USTechnique: Transabdominal and transvaginal ultrasound was performed for evaluation of the gestation as well as the maternal uterus and adnexal regions.  Findings:  There is no intrauterine gestational sac.  The endometrium appears normal measuring 9.2 mm in thickness.  Maternal uterus/adnexae:  Both ovaries appear normal.  A small amount of free fluid is noted within the pelvis.  IMPRESSION:  1.  There is no evidence for intrauterine pregnancy. 2.  Small amount of free fluid within the pelvis   Original Report Authenticated By: Rosealee Albee, M.D.      1. Pregnancy   2. Abdominal pain, acute       MDM  21 year old female with left lower cord or abdominal pain, nausea and vaginal spotting a week ago. Pregnancy test here is positive. We'll get beta hCG  quantitative, patient noted BP positive on prior lab results. Will get transvaginal ultrasound to verify position of pregnancy. Concern for ectopic given location of her pain and reported spotting, differential also includes miscarriage, or normal pregnancy with implantation bleeding. Patient updated on findings and current plan.        Olivia Mackie, MD 03/24/12 [phone]

## 2012-03-24 NOTE — ED Notes (Signed)
Pt. Refused vitals. Stated "I need to pick up my kids now".

## 2012-03-27 ENCOUNTER — Telehealth (HOSPITAL_COMMUNITY): Payer: Self-pay | Admitting: Emergency Medicine

## 2012-03-27 NOTE — ED Notes (Signed)
Results received from [facility].  (+) Chlamydia.  No antibiotic treatment or Prescription given for STD.  Chart to MD office for review.  DHHS form attached. 

## 2012-03-30 ENCOUNTER — Telehealth (HOSPITAL_COMMUNITY): Payer: Self-pay | Admitting: Emergency Medicine

## 2012-03-30 NOTE — ED Notes (Signed)
Chart returned from EDP office. Prescribed Azithromycin 1 gram PO. Ensure follow-up with GYN and/or STD clinic. Prescribed/reviewed by Felicie Morn NPC.

## 2012-03-31 ENCOUNTER — Telehealth (HOSPITAL_COMMUNITY): Payer: Self-pay | Admitting: Emergency Medicine

## 2012-04-01 ENCOUNTER — Telehealth (HOSPITAL_COMMUNITY): Payer: Self-pay | Admitting: Emergency Medicine

## 2012-04-04 ENCOUNTER — Telehealth (HOSPITAL_COMMUNITY): Payer: Self-pay | Admitting: *Deleted

## 2012-04-04 NOTE — ED Notes (Signed)
I have attempted without success to contact this patient by phone: letter sent to [facility] address.

## 2012-04-23 NOTE — ED Notes (Signed)
No response after 30 days. Chart appended and sent to Medical Records. 

## 2012-12-12 ENCOUNTER — Emergency Department (HOSPITAL_COMMUNITY)
Admission: EM | Admit: 2012-12-12 | Discharge: 2012-12-12 | Disposition: A | Discharge status: Home / Self Care | Payer: Self-pay | Attending: Emergency Medicine | Admitting: Emergency Medicine

## 2012-12-12 ENCOUNTER — Encounter (HOSPITAL_COMMUNITY): Payer: Self-pay | Admitting: Adult Health

## 2012-12-12 DIAGNOSIS — F17200 Nicotine dependence, unspecified, uncomplicated: Secondary | ICD-10-CM | POA: Insufficient documentation

## 2012-12-12 DIAGNOSIS — B9689 Other specified bacterial agents as the cause of diseases classified elsewhere: Secondary | ICD-10-CM

## 2012-12-12 DIAGNOSIS — Z8619 Personal history of other infectious and parasitic diseases: Secondary | ICD-10-CM | POA: Insufficient documentation

## 2012-12-12 DIAGNOSIS — N760 Acute vaginitis: Secondary | ICD-10-CM | POA: Insufficient documentation

## 2012-12-12 DIAGNOSIS — N898 Other specified noninflammatory disorders of vagina: Secondary | ICD-10-CM | POA: Insufficient documentation

## 2012-12-12 DIAGNOSIS — Z8659 Personal history of other mental and behavioral disorders: Secondary | ICD-10-CM | POA: Insufficient documentation

## 2012-12-12 DIAGNOSIS — Z3202 Encounter for pregnancy test, result negative: Secondary | ICD-10-CM | POA: Insufficient documentation

## 2012-12-12 LAB — POCT PREGNANCY, URINE: Preg Test, Ur: NEGATIVE

## 2012-12-12 LAB — WET PREP, GENITAL

## 2012-12-12 MED ORDER — METRONIDAZOLE 500 MG PO TABS: 500.0000 mg | ORAL_TABLET | Freq: Two times a day (BID) | ORAL | Status: DC

## 2012-12-12 MED ORDER — METRONIDAZOLE 500 MG PO TABS
500.0000 mg | ORAL_TABLET | Freq: Once | ORAL | Status: AC
Start: 2012-12-12 — End: 2012-12-12
  Administered 2012-12-12: 500 mg via ORAL
  Filled 2012-12-12: qty 1

## 2012-12-12 NOTE — ED Notes (Signed)
Presents with recent elective abortion unsure of date but believes it was over one month ago, since has been having intermitttent vaginal bleeding, abdominal cramping, foul odor and thick discharge. When bleeding using 3-4 pads per day. Not currently bleeding at this time.

## 2012-12-12 NOTE — ED Provider Notes (Signed)
History     CSN: [id number]  Arrival date & time 12/12/12  2004   None     Chief Complaint  Patient presents with  . Vaginal Bleeding    (Consider location/radiation/quality/duration/timing/severity/associated sxs/prior treatment) HPI History provided by pt.   Pt had an elective abortion 2 months ago.  Has had intermittent vaginal bleeding since then, but no bleeding currently.  ~1 week ago, she developed a thick, cloudy, fishy-smelling vaginal discharge.  No associated fever, abd pain, N/V/D, urinary or other vaginal sx.  H/o BV.  Has one sexual partner and uses protection intermittently.  D/c'd the OCP prescribed by abortion clinic because she didn't like the way it made her feel.  Past Medical History  Diagnosis Date  . Depression   . Genital herpes     Past Surgical History  Procedure Laterality Date  . Tonsillectomy      History reviewed. No pertinent family history.  History  Substance Use Topics  . Smoking status: Current Every Day Smoker -- 0.50 packs/day  . Smokeless tobacco: Not on file  . Alcohol Use: Yes     Comment: occasion    OB History   Grav Para Term Preterm Abortions TAB SAB Ect Mult Living   2 1 1       1       Review of Systems  All other systems reviewed and are negative.    Allergies  Review of patient's allergies indicates no known allergies.  Home Medications  No current outpatient prescriptions on file.  BP 125/71  Pulse 83  Temp(Src) 98 F (36.7 C) (Oral)  Resp 16  SpO2 100%  Physical Exam  Nursing note and vitals reviewed. Constitutional: She is oriented to person, place, and time. She appears well-developed and well-nourished. No distress.  HENT:  Head: Normocephalic and atraumatic.  Eyes:  Normal appearance  Neck: Normal range of motion.  Cardiovascular: Normal rate and regular rhythm.   Pulmonary/Chest: Effort normal and breath sounds normal. No respiratory distress.  Abdominal: Soft. Bowel sounds are normal. She  exhibits no distension and no mass. There is no tenderness. There is no rebound and no guarding.  Genitourinary:  No CVA tenderness.  Nml external genitalia.  Physiologic vaginal discharge.  Cervix closed and appears nml.  No adnexal or cervical motion tenderess.    Musculoskeletal: Normal range of motion.  Neurological: She is alert and oriented to person, place, and time.  Skin: Skin is warm and dry. No rash noted.  Psychiatric: She has a normal mood and affect. Her behavior is normal.    ED Course  Procedures (including critical care time)  Labs Reviewed  WET PREP, GENITAL - Abnormal; Notable for the following:    Clue Cells Wet Prep HPF POC MANY (*)    WBC, Wet Prep HPF POC FEW (*)    All other components within normal limits  GC/CHLAMYDIA PROBE AMP  POCT PREGNANCY, URINE   No results found.   1. Bacterial vaginosis       MDM  22yo healthy F presents w/ vaginal discharge, 2 mos s/p elective abortion.  On exam, afebrile, abd benign, physiologic vaginal discharge and otherwise unremarkable genitalia.  Wet prep positive for clue cells. Pt received first dose of flagyl and d/c'd home w/ same.  Referred to [facility] to be started on OCPs again.  Return precautions discussed. 11:47 PM        Otilio Miu, PA-C 12/12/12 [phone]

## 2012-12-13 NOTE — ED Provider Notes (Signed)
Medical screening examination/treatment/procedure(s) were performed by non-physician practitioner and as supervising physician I was immediately available for consultation/collaboration.   Charles B. Bernette Mayers, MD 12/13/12 1478

## 2012-12-14 LAB — GC/CHLAMYDIA PROBE AMP
CT Probe RNA: POSITIVE — AB
GC Probe RNA: NEGATIVE

## 2012-12-15 ENCOUNTER — Telehealth (HOSPITAL_COMMUNITY): Payer: Self-pay | Admitting: Emergency Medicine

## 2012-12-15 NOTE — ED Notes (Signed)
Patient has +Chlamydia. 

## 2012-12-19 ENCOUNTER — Telehealth (HOSPITAL_COMMUNITY): Payer: Self-pay | Admitting: *Deleted

## 2012-12-19 NOTE — ED Notes (Signed)
Chart sent to EDP office for review.Ask patient to present to [facility] for full testing.Refrain from sex until all results are obtained. Informed partners of need for testing and treatment. Return to ED for fever,abd pain,nausea or worsening symptoms per [facility].rx fro Azithromycin 250 mg Disp # 4 (four) Sig: 4 tabs po at once.

## 2012-12-21 ENCOUNTER — Telehealth (HOSPITAL_COMMUNITY): Payer: Self-pay | Admitting: Emergency Medicine

## 2012-12-22 ENCOUNTER — Telehealth (HOSPITAL_COMMUNITY): Payer: Self-pay | Admitting: Emergency Medicine

## 2012-12-22 NOTE — ED Notes (Signed)
Pt called.  ID verified x 2.  Pt informed of dx, need for addl tx, notify partner(s) for testing and tx and abstain from sex x 2 wks from tx date.  Rx called to [facility] [phone] and given to RPh.  Letter torn up, not sent.  DHHS form completed and faxed.

## 2012-12-22 NOTE — ED Notes (Signed)
Unable to contact patient via phone. Sent letter. °

## 2012-12-23 ENCOUNTER — Telehealth (HOSPITAL_COMMUNITY): Payer: Self-pay | Admitting: Emergency Medicine

## 2012-12-24 ENCOUNTER — Emergency Department (HOSPITAL_COMMUNITY)
Admission: EM | Admit: 2012-12-24 | Discharge: 2012-12-24 | Discharge status: Against Medical Advice | Payer: Self-pay | Attending: Emergency Medicine | Admitting: Emergency Medicine

## 2012-12-24 ENCOUNTER — Encounter (HOSPITAL_COMMUNITY): Payer: Self-pay | Admitting: *Deleted

## 2012-12-24 DIAGNOSIS — F17200 Nicotine dependence, unspecified, uncomplicated: Secondary | ICD-10-CM | POA: Insufficient documentation

## 2012-12-24 DIAGNOSIS — R109 Unspecified abdominal pain: Secondary | ICD-10-CM | POA: Insufficient documentation

## 2012-12-24 DIAGNOSIS — N898 Other specified noninflammatory disorders of vagina: Secondary | ICD-10-CM | POA: Insufficient documentation

## 2012-12-24 LAB — COMPREHENSIVE METABOLIC PANEL
ALT: 7 U/L (ref 0–35)
Calcium: 9.2 mg/dL (ref 8.4–10.5)
GFR calc Af Amer: >90 mL/min (ref >90)
Glucose, Bld: 85 mg/dL (ref 70–99)
Sodium: 139 mEq/L (ref 135–145)
Total Protein: 7.3 g/dL (ref 6.0–8.3)

## 2012-12-24 LAB — CBC WITH DIFFERENTIAL/PLATELET
Basophils Relative: 1 % (ref 0–1)
HCT: 39.7 % (ref 36.0–46.0)
Hemoglobin: 13.6 g/dL (ref 12.0–15.0)
Lymphocytes Relative: 25 % (ref 12–46)
MCHC: 34.3 g/dL (ref 30.0–36.0)
Monocytes Absolute: 0.3 10*3/uL (ref 0.1–1.0)
Monocytes Relative: 5 % (ref 3–12)
Neutro Abs: 3.4 10*3/uL (ref 1.7–7.7)

## 2012-12-24 NOTE — ED Notes (Signed)
Pt is here with lower abdominal pain that started on Saturday.  LMP: Just finished.  No pain or burning with urination.  Pt reports white vaginal discharge and just finished medicine for bacterial vaginosis

## 2012-12-24 NOTE — ED Notes (Signed)
Called x2 w/ answer

## 2012-12-24 NOTE — ED Notes (Signed)
Call x1 w.no answer

## 2012-12-24 NOTE — ED Notes (Signed)
Called x3 w/ no answer

## 2013-02-25 ENCOUNTER — Emergency Department (HOSPITAL_COMMUNITY)
Admission: EM | Admit: 2013-02-25 | Discharge: 2013-02-25 | Disposition: A | Discharge status: Home / Self Care | Payer: Self-pay | Attending: Emergency Medicine | Admitting: Emergency Medicine

## 2013-02-25 ENCOUNTER — Encounter (HOSPITAL_COMMUNITY): Payer: Self-pay | Admitting: *Deleted

## 2013-02-25 DIAGNOSIS — N949 Unspecified condition associated with female genital organs and menstrual cycle: Secondary | ICD-10-CM | POA: Insufficient documentation

## 2013-02-25 DIAGNOSIS — N760 Acute vaginitis: Secondary | ICD-10-CM

## 2013-02-25 DIAGNOSIS — N898 Other specified noninflammatory disorders of vagina: Secondary | ICD-10-CM | POA: Insufficient documentation

## 2013-02-25 DIAGNOSIS — Z8742 Personal history of other diseases of the female genital tract: Secondary | ICD-10-CM | POA: Insufficient documentation

## 2013-02-25 DIAGNOSIS — F17200 Nicotine dependence, unspecified, uncomplicated: Secondary | ICD-10-CM | POA: Insufficient documentation

## 2013-02-25 DIAGNOSIS — Z202 Contact with and (suspected) exposure to infections with a predominantly sexual mode of transmission: Secondary | ICD-10-CM | POA: Insufficient documentation

## 2013-02-25 DIAGNOSIS — Z8659 Personal history of other mental and behavioral disorders: Secondary | ICD-10-CM | POA: Insufficient documentation

## 2013-02-25 DIAGNOSIS — Z711 Person with feared health complaint in whom no diagnosis is made: Secondary | ICD-10-CM

## 2013-02-25 LAB — WET PREP, GENITAL: Clue Cells Wet Prep HPF POC: NONE SEEN

## 2013-02-25 MED ORDER — DOXYCYCLINE HYCLATE 100 MG PO CAPS: 100.0000 mg | ORAL_CAPSULE | Freq: Two times a day (BID) | ORAL | Status: DC

## 2013-02-25 MED ORDER — LIDOCAINE HCL (PF) 1 % IJ SOLN
INTRAMUSCULAR | Status: AC
  Administered 2013-02-25: 5 mL
  Filled 2013-02-25: qty 5

## 2013-02-25 MED ORDER — AZITHROMYCIN 250 MG PO TABS
1000.0000 mg | ORAL_TABLET | Freq: Once | ORAL | Status: AC
  Administered 2013-02-25: 1000 mg via ORAL
  Filled 2013-02-25: qty 4

## 2013-02-25 MED ORDER — CEFTRIAXONE SODIUM 250 MG IJ SOLR
250.0000 mg | Freq: Once | INTRAMUSCULAR | Status: AC
  Administered 2013-02-25: 250 mg via INTRAMUSCULAR
  Filled 2013-02-25: qty 250

## 2013-02-25 NOTE — ED Notes (Signed)
Pt states that her boyfriend cheated and she thinks she may have STD.  Pt reports vaginal irritation.  No vaginal discharge.

## 2013-02-25 NOTE — ED Provider Notes (Signed)
CSN: [id number]     Arrival date & time 02/25/13  4098 History   First MD Initiated Contact with Patient 02/25/13 0930     Chief Complaint  Patient presents with  . Vaginal irritation    (Consider location/radiation/quality/duration/timing/severity/associated sxs/prior Treatment) HPI Comments: 22 y/o female with a PMHx of genital herpes and BV presents to the ED complaining of vaginal irritation x 3 days. Irritation only present after she "washes" her vaginal area causing burning. Concerned about STDs because her boyfriend had intercourse with another partner and told her he may have an STD. Only uses protection on occasion and is not on birth control. Last had intercourse 4 days ago. Denies abdominal pain, n/v, dysuria, hematuria, increased urinary frequency or urgency, vaginal discharge or bleeding.  The history is provided by the patient.    Past Medical History  Diagnosis Date  . Depression   . Genital herpes    Past Surgical History  Procedure Laterality Date  . Tonsillectomy     No family history on file. History  Substance Use Topics  . Smoking status: Current Every Day Smoker -- 0.50 packs/day  . Smokeless tobacco: Not on file  . Alcohol Use: Yes     Comment: occasion   OB History   Grav Para Term Preterm Abortions TAB SAB Ect Mult Living   2 1 1       1      Review of Systems  Gastrointestinal: Negative for nausea, vomiting and abdominal pain.  Genitourinary: Positive for vaginal pain. Negative for dysuria, urgency, hematuria, flank pain, vaginal bleeding, vaginal discharge and pelvic pain.  Musculoskeletal: Negative for back pain.  All other systems reviewed and are negative.    Allergies  Review of patient's allergies indicates no known allergies.  Home Medications  No current outpatient prescriptions on file. LMP 02/12/2013 Physical Exam  Nursing note and vitals reviewed. Constitutional: She is oriented to person, place, and time. She appears  well-developed and well-nourished. No distress.  HENT:  Head: Normocephalic and atraumatic.  Mouth/Throat: Oropharynx is clear and moist.  Eyes: Conjunctivae are normal.  Neck: Normal range of motion. Neck supple.  Cardiovascular: Normal rate, regular rhythm and normal heart sounds.   Pulmonary/Chest: Effort normal and breath sounds normal.  Abdominal: Soft. Bowel sounds are normal. There is no tenderness.  Genitourinary: Uterus is not tender. Cervix exhibits discharge (thick, white). Cervix exhibits no motion tenderness and no friability. Right adnexum displays no mass, no tenderness and no fullness. Left adnexum displays no mass, no tenderness and no fullness. No erythema, tenderness or bleeding around the vagina. Vaginal discharge (thick, white/yellow without odor) found.  NEFG  Musculoskeletal: Normal range of motion. She exhibits no edema.  Neurological: She is alert and oriented to person, place, and time.  Skin: Skin is warm and dry. She is not diaphoretic.  Psychiatric: She has a normal mood and affect. Her behavior is normal.    ED Course  Procedures (including critical care time) Labs Review Labs Reviewed  WET PREP, GENITAL - Abnormal; Notable for the following:    Yeast Wet Prep HPF POC FEW (*)    WBC, Wet Prep HPF POC TOO NUMEROUS TO COUNT (*)    All other components within normal limits  GC/CHLAMYDIA PROBE AMP   Imaging Review No results found.  MDM   1. Vaginitis   2. Concern about STD in female without diagnosis    Patient with vaginitis, concern for STD. 250 IM rocephin, 1g PO azithromycin given in  ED. Discharge with doxy. No CMT, adnexal tenderness, abdominal tenderness. She is in NAD. Discussed safe sexual practices. Return precautions discussed. Patient states understanding of treatment care plan and is agreeable.     Trevor Mace, PA-C 02/25/13 1103

## 2013-02-27 LAB — GC/CHLAMYDIA PROBE AMP
CT Probe RNA: POSITIVE — AB
GC Probe RNA: NEGATIVE

## 2013-02-27 NOTE — ED Provider Notes (Signed)
Medical screening examination/treatment/procedure(s) were performed by non-physician practitioner and as supervising physician I was immediately available for consultation/collaboration.  Daymian Lill, MD 02/27/13 0858 

## 2013-02-28 ENCOUNTER — Telehealth (HOSPITAL_COMMUNITY): Payer: Self-pay | Admitting: *Deleted

## 2013-02-28 NOTE — ED Notes (Signed)
+   Chlamydia Patient treated with Rocephin And Zithromax-[facility]HS faxed 

## 2013-03-02 ENCOUNTER — Telehealth (HOSPITAL_COMMUNITY): Payer: Self-pay | Admitting: Emergency Medicine

## 2014-02-09 ENCOUNTER — Inpatient Hospital Stay (HOSPITAL_COMMUNITY)
Admission: AD | Admit: 2014-02-09 | Discharge: 2014-02-10 | Disposition: A | Discharge status: Home / Self Care | Payer: Self-pay | Source: Ambulatory Visit | Attending: Obstetrics & Gynecology | Admitting: Obstetrics & Gynecology

## 2014-02-09 ENCOUNTER — Inpatient Hospital Stay (HOSPITAL_COMMUNITY): Payer: Self-pay

## 2014-02-09 ENCOUNTER — Encounter (HOSPITAL_COMMUNITY): Payer: Self-pay

## 2014-02-09 DIAGNOSIS — O2390 Unspecified genitourinary tract infection in pregnancy, unspecified trimester: Secondary | ICD-10-CM | POA: Insufficient documentation

## 2014-02-09 DIAGNOSIS — F329 Major depressive disorder, single episode, unspecified: Secondary | ICD-10-CM | POA: Insufficient documentation

## 2014-02-09 DIAGNOSIS — R109 Unspecified abdominal pain: Secondary | ICD-10-CM | POA: Insufficient documentation

## 2014-02-09 DIAGNOSIS — R102 Pelvic and perineal pain: Secondary | ICD-10-CM

## 2014-02-09 DIAGNOSIS — O99340 Other mental disorders complicating pregnancy, unspecified trimester: Secondary | ICD-10-CM | POA: Insufficient documentation

## 2014-02-09 DIAGNOSIS — F3289 Other specified depressive episodes: Secondary | ICD-10-CM | POA: Insufficient documentation

## 2014-02-09 DIAGNOSIS — O26891 Other specified pregnancy related conditions, first trimester: Secondary | ICD-10-CM

## 2014-02-09 DIAGNOSIS — O98519 Other viral diseases complicating pregnancy, unspecified trimester: Secondary | ICD-10-CM | POA: Insufficient documentation

## 2014-02-09 DIAGNOSIS — A6000 Herpesviral infection of urogenital system, unspecified: Secondary | ICD-10-CM | POA: Insufficient documentation

## 2014-02-09 DIAGNOSIS — A499 Bacterial infection, unspecified: Secondary | ICD-10-CM | POA: Insufficient documentation

## 2014-02-09 DIAGNOSIS — B9689 Other specified bacterial agents as the cause of diseases classified elsewhere: Secondary | ICD-10-CM | POA: Insufficient documentation

## 2014-02-09 DIAGNOSIS — N760 Acute vaginitis: Secondary | ICD-10-CM | POA: Insufficient documentation

## 2014-02-09 DIAGNOSIS — O99330 Smoking (tobacco) complicating pregnancy, unspecified trimester: Secondary | ICD-10-CM | POA: Insufficient documentation

## 2014-02-09 DIAGNOSIS — N949 Unspecified condition associated with female genital organs and menstrual cycle: Secondary | ICD-10-CM | POA: Insufficient documentation

## 2014-02-09 LAB — URINE MICROSCOPIC-ADD ON

## 2014-02-09 LAB — URINALYSIS, ROUTINE W REFLEX MICROSCOPIC
BILIRUBIN URINE: NEGATIVE
GLUCOSE, UA: NEGATIVE mg/dL
HGB URINE DIPSTICK: NEGATIVE
KETONES UR: NEGATIVE mg/dL
Nitrite: NEGATIVE
PH: 7.5 (ref 5.0–8.0)
PROTEIN: NEGATIVE mg/dL
Specific Gravity, Urine: 1.015 (ref 1.005–1.030)
Urobilinogen, UA: 1 mg/dL (ref 0.0–1.0)

## 2014-02-09 LAB — WET PREP, GENITAL
Trich, Wet Prep: NONE SEEN
YEAST WET PREP: NONE SEEN

## 2014-02-09 LAB — POCT PREGNANCY, URINE: PREG TEST UR: POSITIVE — AB

## 2014-02-09 LAB — CBC
HEMATOCRIT: 33.7 % — AB (ref 36.0–46.0)
HEMOGLOBIN: 11.5 g/dL — AB (ref 12.0–15.0)
MCH: 30.7 pg (ref 26.0–34.0)
MCHC: 34.1 g/dL (ref 30.0–36.0)
MCV: 90.1 fL (ref 78.0–100.0)
Platelets: 166 10*3/uL (ref 150–400)
RBC: 3.74 MIL/uL — ABNORMAL LOW (ref 3.87–5.11)
RDW: 13.4 % (ref 11.5–15.5)
WBC: 4.7 10*3/uL (ref 4.0–10.5)

## 2014-02-09 LAB — HCG, QUANTITATIVE, PREGNANCY: hCG, Beta Chain, Quant, S: 20060 m[IU]/mL — ABNORMAL HIGH (ref <5)

## 2014-02-09 NOTE — MAU Note (Signed)
Lower abdominal cramping, sharp & shooting x 2 days. Denies vaginal bleeding. Vaginal discharge, white & creamy; some vaginal irritation. Denies urinary complaints.

## 2014-02-09 NOTE — MAU Provider Note (Signed)
History     CSN: [id number]  Arrival date and time: 02/09/14 2204   First Provider Initiated Contact with Patient 02/09/14 2243      Chief Complaint  Patient presents with  . Possible Pregnancy  . Abdominal Pain   HPI  Karen Mcbride is a 23 y.o. G2P1001 at [redacted]w[redacted]d who presents today with cramping. She states that she has had cramping for about 2 days. She took tylenol and it helped "a little". She denies any bleeding. She has had some white vaginal discharge. She states that her LMP was 01/01/14. She is planning on going to [facility]. She has not made an appointment at this time.   Past Medical History  Diagnosis Date  . Depression   . Genital herpes     Past Surgical History  Procedure Laterality Date  . Tonsillectomy      History reviewed. No pertinent family history.  History  Substance Use Topics  . Smoking status: Current Every Day Smoker -- 0.50 packs/day for 4 years    Types: Cigarettes  . Smokeless tobacco: Not on file  . Alcohol Use: Yes     Comment: occasion    Allergies: No Known Allergies  Prescriptions prior to admission  Medication Sig Dispense Refill  . acetaminophen (TYLENOL) 500 MG tablet Take 1,000 mg by mouth every 6 (six) hours as needed.        ROS Physical Exam   Blood pressure 118/66, pulse 88, temperature 98.4 F (36.9 C), temperature source Oral, resp. rate 18, last menstrual period 01/01/2014.  Physical Exam  Nursing note and vitals reviewed. Constitutional: She is oriented to person, place, and time. She appears well-developed and well-nourished. No distress.  Cardiovascular: Normal rate.   Respiratory: Effort normal.  GI: Soft. There is no tenderness. There is no rebound.  Genitourinary:   External: no lesion Vagina: small amount of frothy white discharge Cervix: pink, smooth, no CMT Uterus: NSSC Adnexa: NT   Neurological: She is alert and oriented to person, place, and time.  Skin: Skin is warm and dry.   Psychiatric: She has a normal mood and affect.    MAU Course  Procedures  Results for orders placed during the hospital encounter of 02/09/14 (from the past 24 hour(s))  URINALYSIS, ROUTINE W REFLEX MICROSCOPIC     Status: Abnormal   Collection Time    02/09/14 10:30 PM      Result Value Ref Range   Color, Urine YELLOW  YELLOW   APPearance CLEAR  CLEAR   Specific Gravity, Urine 1.015  1.005 - 1.030   pH 7.5  5.0 - 8.0   Glucose, UA NEGATIVE  NEGATIVE mg/dL   Hgb urine dipstick NEGATIVE  NEGATIVE   Bilirubin Urine NEGATIVE  NEGATIVE   Ketones, ur NEGATIVE  NEGATIVE mg/dL   Protein, ur NEGATIVE  NEGATIVE mg/dL   Urobilinogen, UA 1.0  0.0 - 1.0 mg/dL   Nitrite NEGATIVE  NEGATIVE   Leukocytes, UA TRACE (*) NEGATIVE  URINE MICROSCOPIC-ADD ON     Status: Abnormal   Collection Time    02/09/14 10:30 PM      Result Value Ref Range   Squamous Epithelial / LPF MANY (*) RARE   WBC, UA 0-2  <3 WBC/hpf   Bacteria, UA FEW (*) RARE   Urine-Other MUCOUS PRESENT    POCT PREGNANCY, URINE     Status: Abnormal   Collection Time    02/09/14 10:45 PM      Result  Value Ref Range   Preg Test, Ur POSITIVE (*) NEGATIVE  WET PREP, GENITAL     Status: Abnormal   Collection Time    02/09/14 10:55 PM      Result Value Ref Range   Yeast Wet Prep HPF POC NONE SEEN  NONE SEEN   Trich, Wet Prep NONE SEEN  NONE SEEN   Clue Cells Wet Prep HPF POC MODERATE (*) NONE SEEN   WBC, Wet Prep HPF POC MODERATE (*) NONE SEEN  CBC     Status: Abnormal   Collection Time    02/09/14 11:13 PM      Result Value Ref Range   WBC 4.7  4.0 - 10.5 K/uL   RBC 3.74 (*) 3.87 - 5.11 MIL/uL   Hemoglobin 11.5 (*) 12.0 - 15.0 g/dL   HCT 33.7 (*) 36.0 - 46.0 %   MCV 90.1  78.0 - 100.0 fL   MCH 30.7  26.0 - 34.0 pg   MCHC 34.1  30.0 - 36.0 g/dL   RDW 13.4  11.5 - 15.5 %   Platelets 166  150 - 400 K/uL  HCG, QUANTITATIVE, PREGNANCY     Status: Abnormal   Collection Time    02/09/14 11:13 PM      Result Value Ref Range    hCG, Beta Chain, Quant, S 20060 (*) <5 mIU/mL    Assessment and Plan   1. Pelvic pain affecting pregnancy in first trimester, antepartum   2. BV (bacterial vaginosis)      Medication List         acetaminophen 500 MG tablet  Commonly known as:  TYLENOL  Take 1,000 mg by mouth every 6 (six) hours as needed.     metroNIDAZOLE 500 MG tablet  Commonly known as:  FLAGYL  Take 1 tablet (500 mg total) by mouth 2 (two) times daily.       Follow-up Information   Schedule an appointment as soon as possible for a visit with [facility].   Contact information:   [address] [place] [address] [phone]       Mathis Bud 02/09/2014, 10:44 PM

## 2014-02-10 DIAGNOSIS — O9989 Other specified diseases and conditions complicating pregnancy, childbirth and the puerperium: Secondary | ICD-10-CM

## 2014-02-10 DIAGNOSIS — N949 Unspecified condition associated with female genital organs and menstrual cycle: Secondary | ICD-10-CM

## 2014-02-10 LAB — GC/CHLAMYDIA PROBE AMP
CT PROBE, AMP APTIMA: POSITIVE — AB
GC PROBE AMP APTIMA: NEGATIVE

## 2014-02-10 MED ORDER — METRONIDAZOLE 500 MG PO TABS: 500.0000 mg | ORAL_TABLET | Freq: Two times a day (BID) | ORAL | Status: DC

## 2014-02-10 NOTE — Discharge Instructions (Signed)
Bacterial Vaginosis Bacterial vaginosis is a vaginal infection that occurs when the normal balance of bacteria in the vagina is disrupted. It results from an overgrowth of certain bacteria. This is the most common vaginal infection in women of childbearing age. Treatment is important to prevent complications, especially in pregnant women, as it can cause a premature delivery. CAUSES  Bacterial vaginosis is caused by an increase in harmful bacteria that are normally present in smaller amounts in the vagina. Several different kinds of bacteria can cause bacterial vaginosis. However, the reason that the condition develops is not fully understood. RISK FACTORS Certain activities or behaviors can put you at an increased risk of developing bacterial vaginosis, including:  Having a new sex partner or multiple sex partners.  Douching.  Using an intrauterine device (IUD) for contraception. Women do not get bacterial vaginosis from toilet seats, bedding, swimming pools, or contact with objects around them. SIGNS AND SYMPTOMS  Some women with bacterial vaginosis have no signs or symptoms. Common symptoms include:  Grey vaginal discharge.  A fishlike odor with discharge, especially after sexual intercourse.  Itching or burning of the vagina and vulva.  Burning or pain with urination. DIAGNOSIS  Your health care provider will take a medical history and examine the vagina for signs of bacterial vaginosis. A sample of vaginal fluid may be taken. Your health care provider will look at this sample under a microscope to check for bacteria and abnormal cells. A vaginal pH test may also be done.  TREATMENT  Bacterial vaginosis may be treated with antibiotic medicines. These may be given in the form of a pill or a vaginal cream. A second round of antibiotics may be prescribed if the condition comes back after treatment.  HOME CARE INSTRUCTIONS   Only take over-the-counter or prescription medicines as  directed by your health care provider.  If antibiotic medicine was prescribed, take it as directed. Make sure you finish it even if you start to feel better.  Do not have sex until treatment is completed.  Tell all sexual partners that you have a vaginal infection. They should see their health care provider and be treated if they have problems, such as a mild rash or itching.  Practice safe sex by using condoms and only having one sex partner. SEEK MEDICAL CARE IF:   Your symptoms are not improving after 3 days of treatment.  You have increased discharge or pain.  You have a fever. MAKE SURE YOU:   Understand these instructions.  Will watch your condition.  Will get help right away if you are not doing well or get worse. FOR MORE INFORMATION  Centers for Disease Control and Prevention, Division of STD Prevention: www.cdc.gov/std American Sexual Health Association (ASHA): www.ashastd.org  Document Released: 06/19/2005 Document Revised: 04/09/2013 Document Reviewed: 01/29/2013 ExitCare Patient Information 2015 [employer]. This information is not intended to replace advice given to you by your health care provider. Make sure you discuss any questions you have with your health care provider.  

## 2014-02-11 ENCOUNTER — Telehealth: Payer: Self-pay | Admitting: General Practice

## 2014-02-11 DIAGNOSIS — A749 Chlamydial infection, unspecified: Secondary | ICD-10-CM

## 2014-02-11 MED ORDER — AZITHROMYCIN 250 MG PO TABS: 1000.0000 mg | ORAL_TABLET | Freq: Once | ORAL | Status: DC

## 2014-02-11 NOTE — Telephone Encounter (Signed)
Message copied by Shelly Coss on Wed Feb 11, 2014 11:40 AM ------      Message from: Debarah Crape A      Created: Wed Feb 11, 2014 11:20 AM                   ----- Message -----         From: [place] L Presnell         Sent: 02/10/2014   9:44 AM           To: [facility]                        ----- Message -----         From: Lab In [place]: 02/10/2014   9:30 AM           To: Ephriam Jenkins Results             ------

## 2014-02-11 NOTE — Telephone Encounter (Signed)
+   chlamydia. Called patient, no answer- left message that we are trying to reach you, please call us back at the clinics. Med ordered and STD card sent.

## 2014-02-16 NOTE — Telephone Encounter (Signed)
Called patient, no answer- left message that we are trying to reach you with some results, please call us back

## 2014-02-17 NOTE — Telephone Encounter (Signed)
Called patient and informed her of results and importance of taking medication correctly and informing partner(s) so that they will be treated as well. Patient also aware to abstain from intercourse for one week following treatment. Patient verbalized understanding to all and states she already took the medication. Patient had no other questions

## 2014-03-10 ENCOUNTER — Encounter: Payer: Self-pay | Admitting: General Practice

## 2014-05-04 ENCOUNTER — Encounter (HOSPITAL_COMMUNITY): Payer: Self-pay

## 2014-08-29 IMAGING — US US OB LIMITED
1 series · 14 of 28 positions shown · non-contrast
Comparison: none

CLINICAL DATA: Pain.  Constipation and nausea.

OBSTETRIC <14 WK ULTRASOUND, TRANSVAGINAL OB USTechnique:
Transabdominal and transvaginal ultrasound was performed for
evaluation of the gestation as well as the maternal uterus and
adnexal regions.

[Series 1: us ob limited · 0.20mm/px · 14 of 50 slices shown]
[im 2/50]
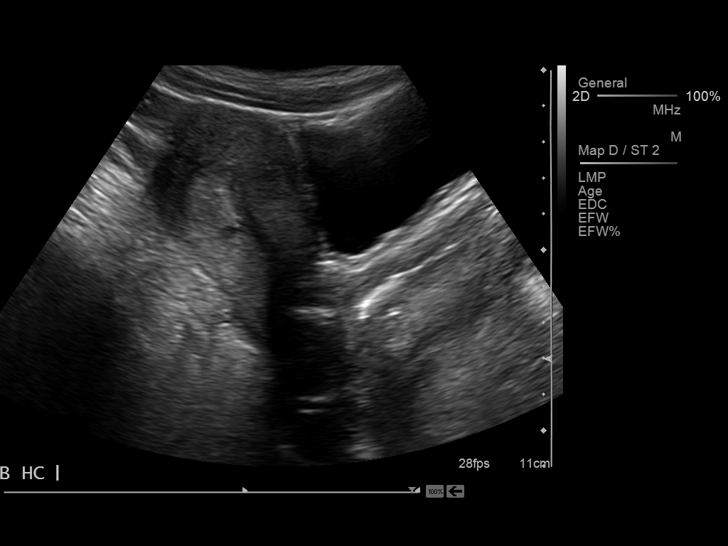
[im 6/50]
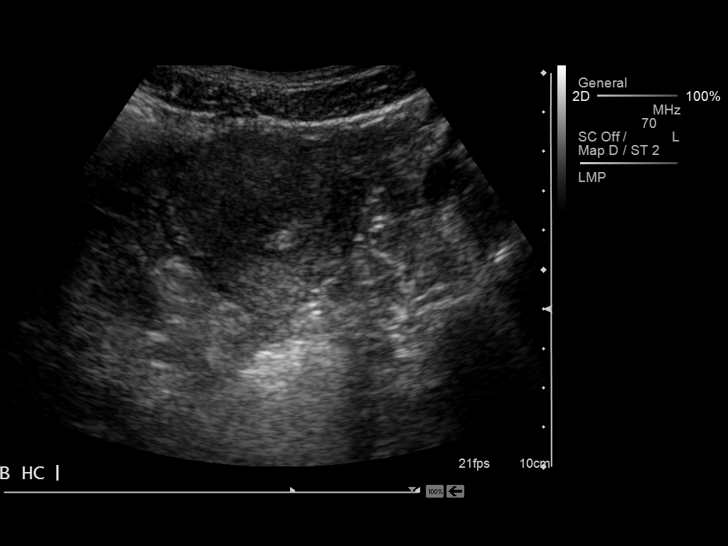
[im 10/50]
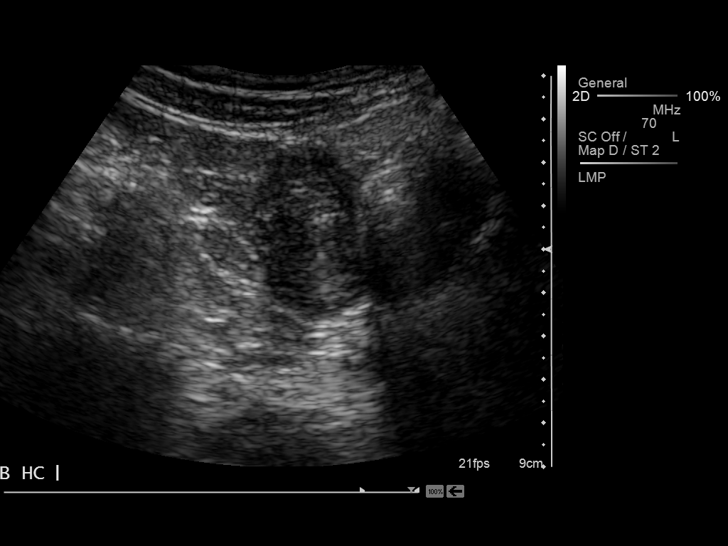
[im 13/50]
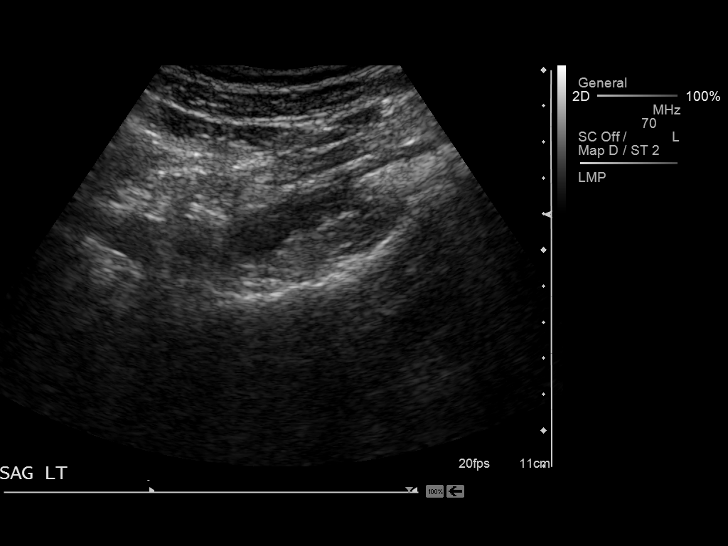
[im 17/50]
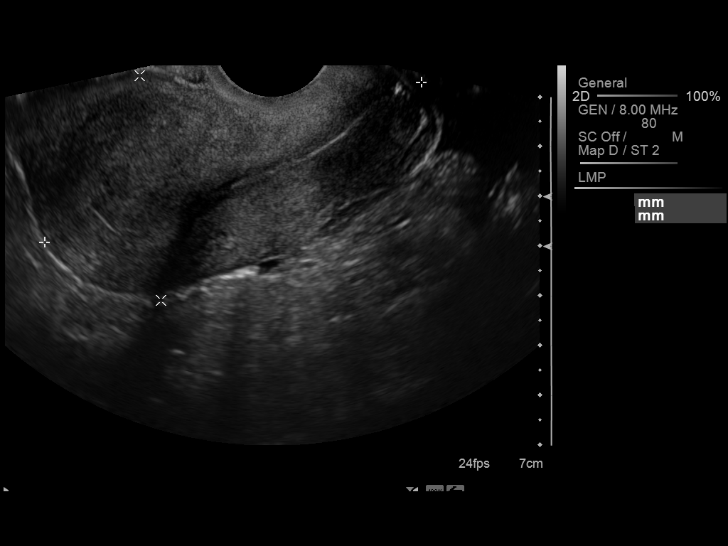
[im 20/50]
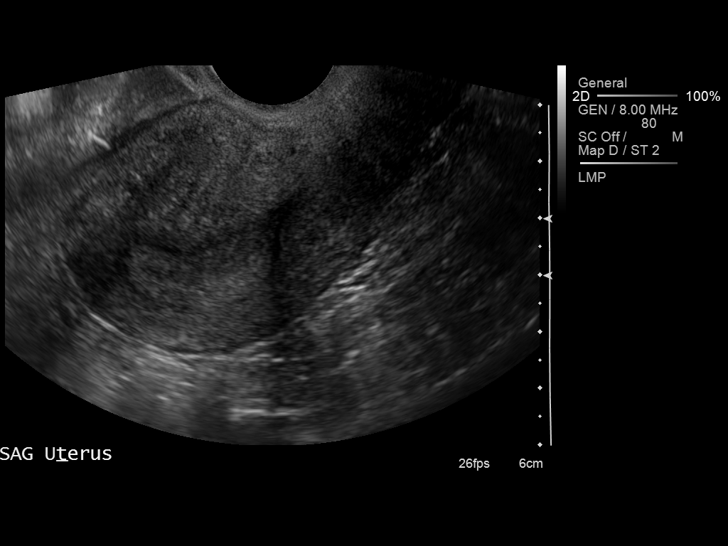
[im 24/50]
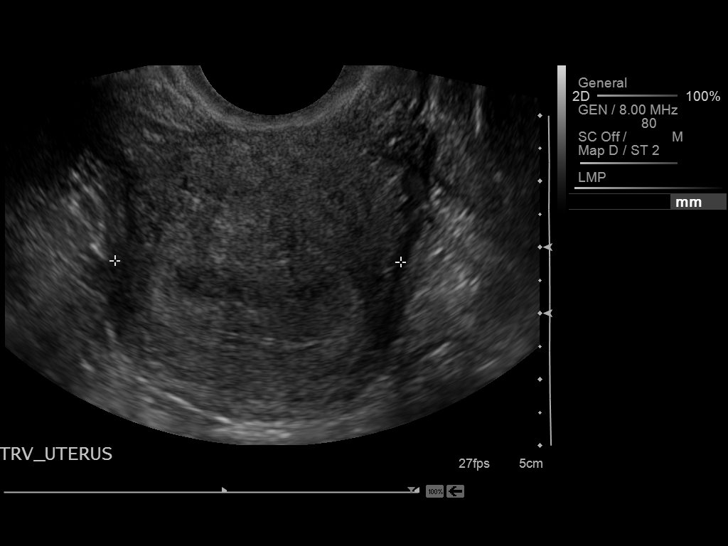
[im 28/50]
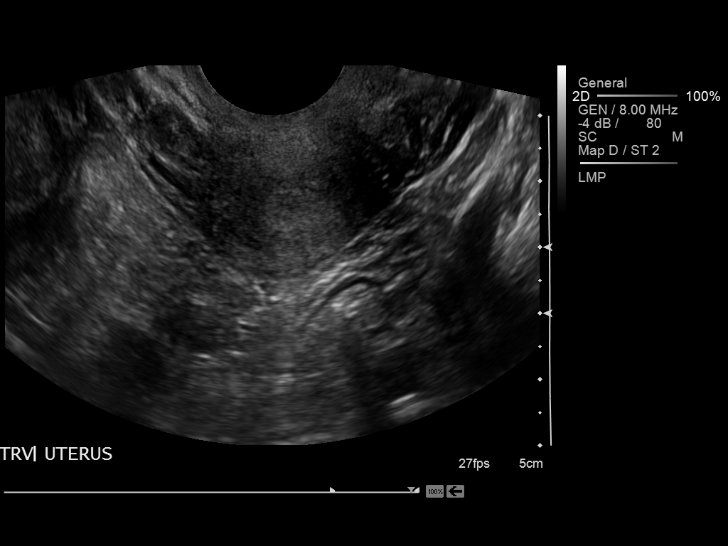
[im 31/50]
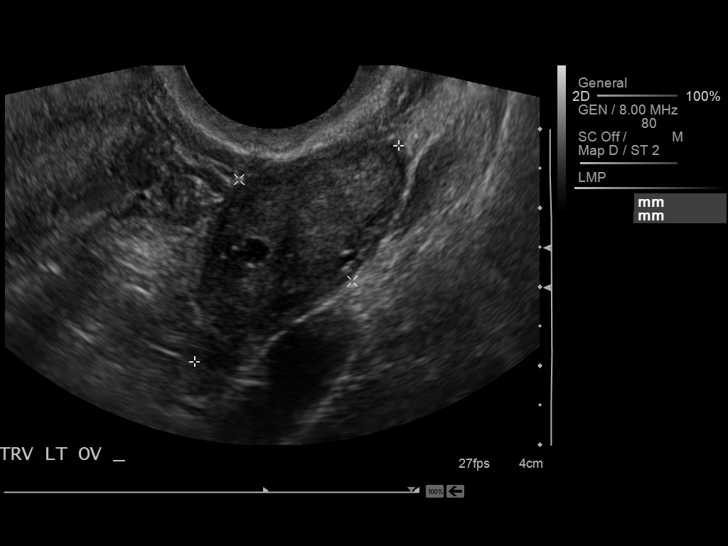
[im 35/50]
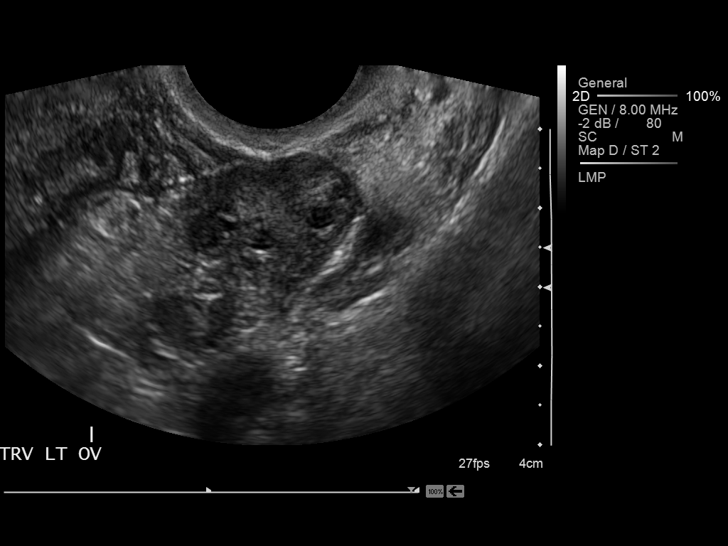
[im 39/50]
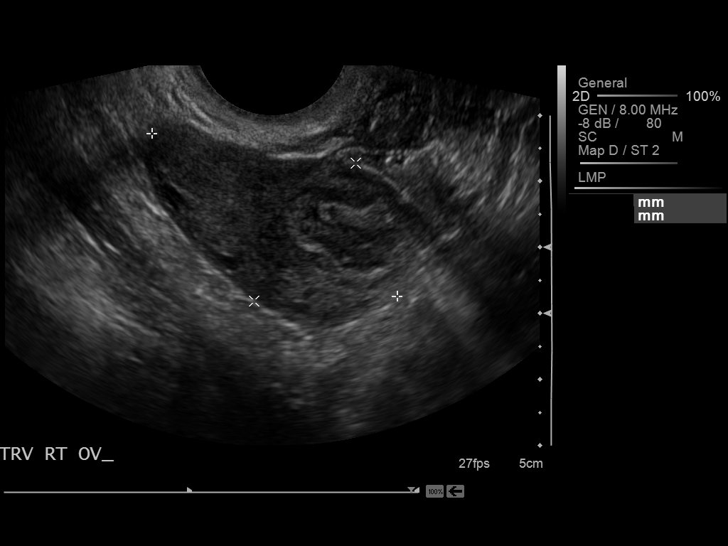
[im 42/50]
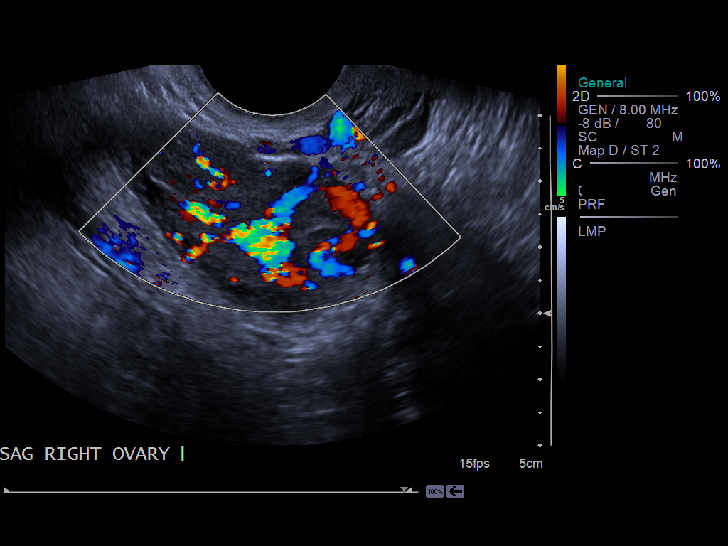
[im 46/50]
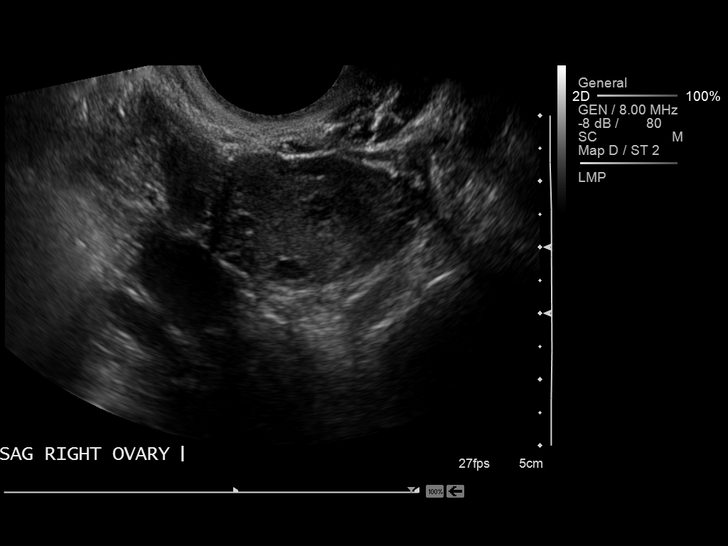
[im 50/50]
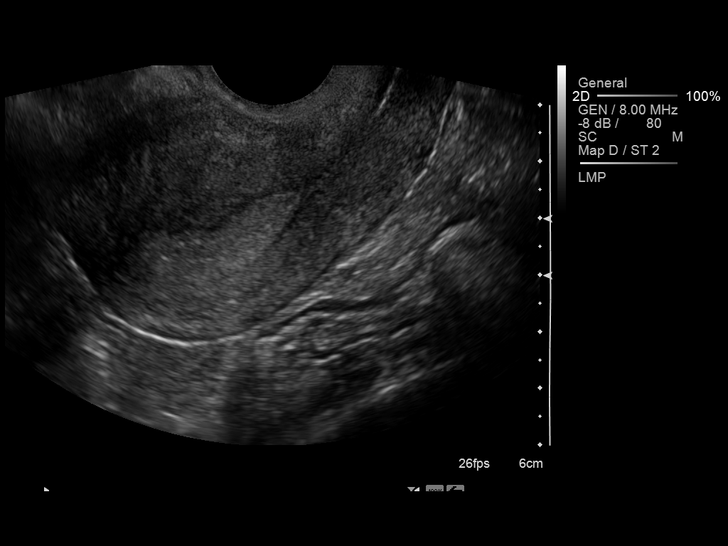

[14 of 28 positions shown; findings below may reference images not displayed]

FINDINGS: There is no intrauterine gestational sac.

The endometrium appears normal measuring 9.2 mm in thickness.

Maternal uterus/adnexae:

Both ovaries appear normal.

A small amount of free fluid is noted within the pelvis.
IMPRESSION: 1.  There is no evidence for intrauterine pregnancy.
2.  Small amount of free fluid within the pelvis

## 2014-12-15 ENCOUNTER — Encounter (HOSPITAL_COMMUNITY): Payer: Self-pay | Admitting: *Deleted

## 2016-07-03 NOTE — L&D Delivery Note (Signed)
Patient is a 26 y.o. now [id number] who admitted for induction of labor for post dates and nonreassuring fetal heart tracing outpatient, now s/p NSVD at [redacted]w[redacted]d.   Delivery Note At 5:32 PM a viable female was delivered via Vaginal, Spontaneous Delivery (Presentation: ROA  ).  APGAR: , ; weight pending.   Placenta status: intact to L&D.  Cord: three vessel.  Cord pH: n/a  Anesthesia:  Epidural Episiotomy: None Lacerations: 1st degree perineal hemostatic, well approximated not requiring repair Suture Repair: none Est. Blood Loss (mL): 1000  Mom to postpartum.  Baby to Couplet care / Skin to Skin.   Head delivered ROA. No nuchal cord present. There was 20 second shoulder dystocia that resolved with McRoberts and suprapubic pressure. Body delivered in usual fashion. There was loose body cord present.  Infant to mother's abdomen, vigorous and crying. Cord clamped x 2 after 1-minute delay, and cut by family member. Cord blood drawn. Placenta delivered spontaneously with gentle cord traction and noted to be intact.  Fundus was noted to be firm with massage and Pitocin, however, there was continuous bleeding.  The lower uterine segment was noted to have some atony.  Methergine was administered and bleeding decreased.  Perineum was inspected and found to have 1st degree superficial laceration that was hemostatic not requiring repair.  Cervix was evaluated and no lacerations noted.  Fundal massage was continued and 1052mcg cytotec administered rectally.  Bleeding stopped. Fundus remained firm.  Vital signs were stable.    Regenia Skeeter, DO PGY-2  OB FELLOW DELIVERY ATTESTATION  I was gloved and present for the delivery in its entirety, and I agree with the above resident's note.    Gailen Shelter, MD OB Fellow 6:43 PM    Jenne Pane. Dominick Morella, MD OB Fellow 02/27/17, 6:28 PM

## 2016-07-07 ENCOUNTER — Inpatient Hospital Stay (HOSPITAL_COMMUNITY): Payer: Medicaid Other

## 2016-07-07 ENCOUNTER — Encounter (HOSPITAL_COMMUNITY): Payer: Self-pay | Admitting: *Deleted

## 2016-07-07 ENCOUNTER — Inpatient Hospital Stay (HOSPITAL_COMMUNITY)
Admission: AD | Admit: 2016-07-07 | Discharge: 2016-07-07 | Disposition: A | Discharge status: Home / Self Care | Payer: Medicaid Other | Source: Ambulatory Visit | Attending: Obstetrics & Gynecology | Admitting: Obstetrics & Gynecology

## 2016-07-07 DIAGNOSIS — F329 Major depressive disorder, single episode, unspecified: Secondary | ICD-10-CM | POA: Diagnosis not present

## 2016-07-07 DIAGNOSIS — O99332 Smoking (tobacco) complicating pregnancy, second trimester: Secondary | ICD-10-CM | POA: Diagnosis not present

## 2016-07-07 DIAGNOSIS — Z79899 Other long term (current) drug therapy: Secondary | ICD-10-CM | POA: Insufficient documentation

## 2016-07-07 DIAGNOSIS — O26892 Other specified pregnancy related conditions, second trimester: Secondary | ICD-10-CM | POA: Insufficient documentation

## 2016-07-07 DIAGNOSIS — F17210 Nicotine dependence, cigarettes, uncomplicated: Secondary | ICD-10-CM | POA: Diagnosis not present

## 2016-07-07 DIAGNOSIS — O26899 Other specified pregnancy related conditions, unspecified trimester: Secondary | ICD-10-CM

## 2016-07-07 DIAGNOSIS — O9989 Other specified diseases and conditions complicating pregnancy, childbirth and the puerperium: Secondary | ICD-10-CM

## 2016-07-07 DIAGNOSIS — Z3A01 Less than 8 weeks gestation of pregnancy: Secondary | ICD-10-CM | POA: Diagnosis not present

## 2016-07-07 DIAGNOSIS — O99342 Other mental disorders complicating pregnancy, second trimester: Secondary | ICD-10-CM | POA: Insufficient documentation

## 2016-07-07 DIAGNOSIS — Z3491 Encounter for supervision of normal pregnancy, unspecified, first trimester: Secondary | ICD-10-CM

## 2016-07-07 DIAGNOSIS — R109 Unspecified abdominal pain: Secondary | ICD-10-CM | POA: Insufficient documentation

## 2016-07-07 LAB — POCT PREGNANCY, URINE: Preg Test, Ur: POSITIVE — AB

## 2016-07-07 LAB — URINALYSIS, ROUTINE W REFLEX MICROSCOPIC
BILIRUBIN URINE: NEGATIVE
GLUCOSE, UA: NEGATIVE mg/dL
Hgb urine dipstick: NEGATIVE
Ketones, ur: NEGATIVE mg/dL
Leukocytes, UA: NEGATIVE
NITRITE: NEGATIVE
PH: 6 (ref 5.0–8.0)
Protein, ur: NEGATIVE mg/dL
SPECIFIC GRAVITY, URINE: 1.025 (ref 1.005–1.030)

## 2016-07-07 LAB — CBC
HEMATOCRIT: 37.7 % (ref 36.0–46.0)
Hemoglobin: 12.8 g/dL (ref 12.0–15.0)
MCH: 29.5 pg (ref 26.0–34.0)
MCHC: 34 g/dL (ref 30.0–36.0)
MCV: 86.9 fL (ref 78.0–100.0)
Platelets: 196 10*3/uL (ref 150–400)
RBC: 4.34 MIL/uL (ref 3.87–5.11)
RDW: 13.8 % (ref 11.5–15.5)
WBC: 4.7 10*3/uL (ref 4.0–10.5)

## 2016-07-07 LAB — WET PREP, GENITAL
SPERM: NONE SEEN
TRICH WET PREP: NONE SEEN
YEAST WET PREP: NONE SEEN

## 2016-07-07 LAB — ABO/RH: ABO/RH(D): B POS

## 2016-07-07 LAB — HCG, QUANTITATIVE, PREGNANCY: hCG, Beta Chain, Quant, S: 121642 m[IU]/mL — ABNORMAL HIGH (ref <5)

## 2016-07-07 MED ORDER — PRENATAL VITAMIN 27-0.8 MG PO TABS: 1.0000 | ORAL_TABLET | Freq: Every day | ORAL | 5 refills | Status: DC

## 2016-07-07 NOTE — MAU Note (Signed)
Pretty sure she is pregnant.  Started cramping 2 days ago.  Wanted to make sure everything is ok. +HPT on Sunday

## 2016-07-07 NOTE — MAU Provider Note (Signed)
Chief Complaint: Possible Pregnancy and Abdominal Cramping   SUBJECTIVE HPI: Karen Mcbride is a 26 y.o. G2P1001 at Unknown who presents to [facility] reporting +HPT with abdominal cramping.  Noticing abdominal cramping, started 2 days ago, like menstrual cramping. Comes/goes, does not last long when occurs, but throughout day. LMP 05/17/16. No vaginal bleeding. Has a 47 year old son, who is healthy. Not on birth control. Has had h/o chlamydia (2017), has known genital herpes, a "while" since last outbreak 1-2 years she thinks. Not taking prenatal vitamins. This was not a planned pregnancy but welcomed.   Patient needs proof of pregnancy.     Past Medical History:  Diagnosis Date  . Depression   . Genital herpes    OB History  Gravida Para Term Preterm AB Living  2 1 1     1   SAB TAB Ectopic Multiple Live Births          1    # Outcome Date GA Lbr Len/2nd Weight Sex Delivery Anes PTL Lv  2 Gravida           1 Term 2010     Vag-Spont   LIV     Past Surgical History:  Procedure Laterality Date  . TONSILLECTOMY     Social History   Social History  . Marital status: Single    Spouse name: N/A  . Number of children: N/A  . Years of education: N/A   Occupational History  . Not on file.   Social History Main Topics  . Smoking status: Current Every Day Smoker    Packs/day: 0.50    Years: 4.00    Types: Cigarettes  . Smokeless tobacco: Not on file  . Alcohol use Yes     Comment: occasion  . Drug use: No  . Sexual activity: Yes    Birth control/ protection: None   Other Topics Concern  . Not on file   Social History Narrative  . No narrative on file   No current facility-administered medications on file prior to encounter.    Current Outpatient Prescriptions on File Prior to Encounter  Medication Sig Dispense Refill  . acetaminophen (TYLENOL) 500 MG tablet Take 1,000 mg by mouth every 6 (six) hours as needed.    Marland Kitchen azithromycin (ZITHROMAX) 250 MG  tablet Take 4 tablets (1,000 mg total) by mouth once. 4 tablet 0  . metroNIDAZOLE (FLAGYL) 500 MG tablet Take 1 tablet (500 mg total) by mouth 2 (two) times daily. 14 tablet 0   No Known Allergies  I have reviewed the past Medical Hx, Surgical Hx, Social Hx, Allergies and Medications.   REVIEW OF SYSTEMS  RESPIRATORY: no cough, shortness of breath, or wheezing CARDIOVASCULAR: no chest pain or dyspnea on exertion GASTROINTESTINAL: abdominal cramping, no change in bowel habits, or black or bloody stools GENITO-URINARY: no dysuria, trouble voiding, or hematuria negative for - genital discharge, vulvar/vaginal symptoms or vaginal bleeding MUSKULOSKELETAL: negative for - gait disturbance or swelling in ankle - bilateral, foot - bilateral and leg - bilateral NEUROLOGICAL: negative for - dizziness, gait disturbance, headaches, numbness/tingling or visual changes DERMATOLOGICAL: negative   OBJECTIVE Patient Vitals for the past 24 hrs:  BP Temp Temp src Pulse Resp Weight  07/07/16 1224 121/68 98.6 F (37 C) Oral 89 20 -  07/07/16 1145 123/56 98.5 F (36.9 C) Oral 85 16 145 lb 8 oz (66 kg)    PHYSICAL EXAM Constitutional: Well-developed, well-nourished female in no acute distress.  Cardiovascular: normal  rate, rhythm, no murmurs Respiratory: normal rate and effort. CTAB GI: Abd soft, minimally tender in mid lower abdomen/pelvis, non-distended. Pos BS x 4 MS: Extremities nontender, no edema, normal ROM Neurologic: Alert and oriented x 4.  GU: Neg CVAT. SPECULUM EXAM: NEFG, physiologic discharge, no blood noted, cervix clean BIMANUAL: cervix closed; uterus normal size for dates, no adnexal tenderness or masses. No CMT.  LAB RESULTS Results for orders placed or performed during the hospital encounter of 07/07/16 (from the past 24 hour(s))  Urinalysis, Routine w reflex microscopic     Status: Abnormal   Collection Time: 07/07/16 11:47 AM  Result Value Ref Range   Color, Urine YELLOW  YELLOW   APPearance HAZY (A) CLEAR   Specific Gravity, Urine 1.025 1.005 - 1.030   pH 6.0 5.0 - 8.0   Glucose, UA NEGATIVE NEGATIVE mg/dL   Hgb urine dipstick NEGATIVE NEGATIVE   Bilirubin Urine NEGATIVE NEGATIVE   Ketones, ur NEGATIVE NEGATIVE mg/dL   Protein, ur NEGATIVE NEGATIVE mg/dL   Nitrite NEGATIVE NEGATIVE   Leukocytes, UA NEGATIVE NEGATIVE  Pregnancy, urine POC     Status: Abnormal   Collection Time: 07/07/16 12:20 PM  Result Value Ref Range   Preg Test, Ur POSITIVE (A) NEGATIVE  Wet prep, genital     Status: Abnormal   Collection Time: 07/07/16 12:53 PM  Result Value Ref Range   Yeast Wet Prep HPF POC NONE SEEN NONE SEEN   Trich, Wet Prep NONE SEEN NONE SEEN   Clue Cells Wet Prep HPF POC PRESENT (A) NONE SEEN   WBC, Wet Prep HPF POC MANY (A) NONE SEEN   Sperm NONE SEEN   CBC     Status: None   Collection Time: 07/07/16  1:04 PM  Result Value Ref Range   WBC 4.7 4.0 - 10.5 K/uL   RBC 4.34 3.87 - 5.11 MIL/uL   Hemoglobin 12.8 12.0 - 15.0 g/dL   HCT 37.7 36.0 - 46.0 %   MCV 86.9 78.0 - 100.0 fL   MCH 29.5 26.0 - 34.0 pg   MCHC 34.0 30.0 - 36.0 g/dL   RDW 13.8 11.5 - 15.5 %   Platelets 196 150 - 400 K/uL  hCG, quantitative, pregnancy     Status: Abnormal   Collection Time: 07/07/16  1:04 PM  Result Value Ref Range   hCG, Beta Chain, Quant, S 121,642 (H) <5 mIU/mL  ABO/Rh     Status: None   Collection Time: 07/07/16  1:04 PM  Result Value Ref Range   ABO/RH(D) B POS     IMAGING US Ob Comp Less 14 Wks  Result Date: 07/07/2016 CLINICAL DATA:  Cramping 2 days ago EXAM: OBSTETRIC <14 WK [place] AND TRANSVAGINAL OB US TECHNIQUE: Both transabdominal and transvaginal ultrasound examinations were performed for complete evaluation of the gestation as well as the maternal uterus, adnexal regions, and pelvic cul-de-sac. Transvaginal technique was performed to assess early pregnancy. COMPARISON:  None. FINDINGS: Intrauterine gestational sac: Single Yolk sac:  Visualized Embryo:   Visualized Cardiac Activity: Visualized Heart Rate: 148  bpm MSD:   mm    w     d CRL:  10.1  mm   7 w   1 d                  [place] EDC: 02/22/2017 Subchorionic hemorrhage:  None visualized. Maternal uterus/adnexae: No adnexal mass or free fluid IMPRESSION: Seven week 1 day intrauterine pregnancy. Fetal heart rate 148 beats per minute.  No acute maternal findings. Electronically Signed   By: Rolm Baptise M.D.   On: 07/07/2016 14:31   US Ob Transvaginal  Result Date: 07/07/2016 CLINICAL DATA:  Cramping 2 days ago EXAM: OBSTETRIC <14 WK [place] AND TRANSVAGINAL OB US TECHNIQUE: Both transabdominal and transvaginal ultrasound examinations were performed for complete evaluation of the gestation as well as the maternal uterus, adnexal regions, and pelvic cul-de-sac. Transvaginal technique was performed to assess early pregnancy. COMPARISON:  None. FINDINGS: Intrauterine gestational sac: Single Yolk sac:  Visualized Embryo:  Visualized Cardiac Activity: Visualized Heart Rate: 148  bpm MSD:   mm    w     d CRL:  10.1  mm   7 w   1 d                  [place] EDC: 02/22/2017 Subchorionic hemorrhage:  None visualized. Maternal uterus/adnexae: No adnexal mass or free fluid IMPRESSION: Seven week 1 day intrauterine pregnancy. Fetal heart rate 148 beats per minute. No acute maternal findings. Electronically Signed   By: Rolm Baptise M.D.   On: 07/07/2016 14:31    MAU COURSE CBC HCG TVUS/AUS - IUP with cardiac activity [redacted]w[redacted]d (LMP [redacted]w[redacted]d)  MDM Plan of care reviewed with patient, including labs and tests ordered and medical treatment. Discussed with patient she has a normal IUP. Reviewed warning signs/symptoms.   ASSESSMENT 1. Normal IUP (intrauterine pregnancy) on prenatal ultrasound, first trimester   2. Abdominal cramping affecting pregnancy     PLAN Discharge home in stable condition. Bleeding precautions given IUP - proof of pregnancy given OB provider list given   Allergies as of 07/07/2016   No Known Allergies      Medication List    STOP taking these medications   azithromycin 250 MG tablet Commonly known as:  ZITHROMAX   metroNIDAZOLE 500 MG tablet Commonly known as:  FLAGYL     TAKE these medications   Prenatal Vitamin 27-0.8 MG Tabs Take 1 tablet by mouth daily.        Katherine Basset, DO OB Fellow 07/07/2016 12:36 PM

## 2016-07-07 NOTE — Progress Notes (Signed)
LMP 05/17/2016

## 2016-07-07 NOTE — MAU Note (Signed)
Patient feels like she is having period type cramping, believes she is six weeks pregnant

## 2016-07-07 NOTE — Discharge Instructions (Signed)
Abdominal Pain During Pregnancy Belly (abdominal) pain is common during pregnancy. Most of the time, it is not a serious problem. Other times, it can be a sign that something is wrong with the pregnancy. Always tell your doctor if you have belly pain. Follow these instructions at home: Monitor your belly pain for any changes. The following actions may help you feel better:  Do not have sex (intercourse) or put anything in your vagina until you feel better.  Rest until your pain stops.  Drink clear fluids if you feel sick to your stomach (nauseous). Do not eat solid food until you feel better.  Only take medicine as told by your doctor.  Keep all doctor visits as told. Get help right away if:  You are bleeding, leaking fluid, or pieces of tissue come out of your vagina.  You have more pain or cramping.  You keep throwing up (vomiting).  You have pain when you pee (urinate) or have blood in your pee.  You have a fever.  You do not feel your baby moving as much.  You feel very weak or feel like passing out.  You have trouble breathing, with or without belly pain.  You have a very bad headache and belly pain.  You have fluid leaking from your vagina and belly pain.  You keep having watery poop (diarrhea).  Your belly pain does not go away after resting, or the pain gets worse. This information is not intended to replace advice given to you by your health care provider. Make sure you discuss any questions you have with your health care provider. Document Released: 06/07/2009 Document Revised: 01/26/2016 Document Reviewed: 01/16/2013 Elsevier Interactive Patient Education  2017 [place] of Pregnancy The first trimester of pregnancy is from week 1 until the end of week 12 (months 1 through 3). During this time, your baby will begin to develop inside you. At 6-8 weeks, the eyes and face are formed, and the heartbeat can be seen on ultrasound. At the end of  12 weeks, all the baby's organs are formed. Prenatal care is all the medical care you receive before the birth of your baby. Make sure you get good prenatal care and follow all of your doctor's instructions. Follow these instructions at home: Medicines  Take medicine only as told by your doctor. Some medicines are safe and some are not during pregnancy.  Take your prenatal vitamins as told by your doctor.  Take medicine that helps you poop (stool softener) as needed if your doctor says it is okay. Diet  Eat regular, healthy meals.  Your doctor will tell you the amount of weight gain that is right for you.  Avoid raw meat and uncooked cheese.  If you feel sick to your stomach (nauseous) or throw up (vomit):  Eat 4 or 5 small meals a day instead of 3 large meals.  Try eating a few soda crackers.  Drink liquids between meals instead of during meals.  If you have a hard time pooping (constipation):  Eat high-fiber foods like fresh vegetables, fruit, and whole grains.  Drink enough fluids to keep your pee (urine) clear or pale yellow. Activity and Exercise  Exercise only as told by your doctor. Stop exercising if you have cramps or pain in your lower belly (abdomen) or low back.  Try to avoid standing for long periods of time. Move your legs often if you must stand in one place for a long time.  Avoid  heavy lifting.  Wear low-heeled shoes. Sit and stand up straight.  You can have sex unless your doctor tells you not to. Relief of Pain or Discomfort  Wear a good support bra if your breasts are sore.  Take warm water baths (sitz baths) to soothe pain or discomfort caused by hemorrhoids. Use hemorrhoid cream if your doctor says it is okay.  Rest with your legs raised if you have leg cramps or low back pain.  Wear support hose if you have puffy, bulging veins (varicose veins) in your legs. Raise (elevate) your feet for 15 minutes, 3-4 times a day. Limit salt in your  diet. Prenatal Care  Schedule your prenatal visits by the twelfth week of pregnancy.  Write down your questions. Take them to your prenatal visits.  Keep all your prenatal visits as told by your doctor. Safety  Wear your seat belt at all times when driving.  Make a list of emergency phone numbers. The list should include numbers for family, friends, the hospital, and police and fire departments. General Tips  Ask your doctor for a referral to a local prenatal class. Begin classes no later than at the start of month 6 of your pregnancy.  Ask for help if you need counseling or help with nutrition. Your doctor can give you advice or tell you where to go for help.  Do not use hot tubs, steam rooms, or saunas.  Do not douche or use tampons or scented sanitary pads.  Do not cross your legs for long periods of time.  Avoid litter boxes and soil used by cats.  Avoid all smoking, herbs, and alcohol. Avoid drugs not approved by your doctor.  Do not use any tobacco products, including cigarettes, chewing tobacco, and electronic cigarettes. If you need help quitting, ask your doctor. You may get counseling or other support to help you quit.  Visit your dentist. At home, brush your teeth with a soft toothbrush. Be gentle when you floss. Get help if:  You are dizzy.  You have mild cramps or pressure in your lower belly.  You have a nagging pain in your belly area.  You continue to feel sick to your stomach, throw up, or have watery poop (diarrhea).  You have a bad smelling fluid coming from your vagina.  You have pain with peeing (urination).  You have increased puffiness (swelling) in your face, hands, legs, or ankles. Get help right away if:  You have a fever.  You are leaking fluid from your vagina.  You have spotting or bleeding from your vagina.  You have very bad belly cramping or pain.  You gain or lose weight rapidly.  You throw up blood. It may look like coffee  grounds.  You are around people who have [place] measles, fifth disease, or chickenpox.  You have a very bad headache.  You have shortness of breath.  You have any kind of trauma, such as from a fall or a car accident. This information is not intended to replace advice given to you by your health care provider. Make sure you discuss any questions you have with your health care provider. Document Released: 12/06/2007 Document Revised: 11/25/2015 Document Reviewed: 04/29/2013 Elsevier Interactive Patient Education  2017 [employer].

## 2016-07-10 LAB — GC/CHLAMYDIA PROBE AMP (~~LOC~~) NOT AT ARMC
CHLAMYDIA, DNA PROBE: NEGATIVE
NEISSERIA GONORRHEA: NEGATIVE

## 2016-09-01 ENCOUNTER — Inpatient Hospital Stay (HOSPITAL_COMMUNITY)
Admission: AD | Admit: 2016-09-01 | Discharge: 2016-09-01 | Disposition: A | Discharge status: Home / Self Care | Payer: Medicaid Other | Source: Ambulatory Visit | Attending: Obstetrics and Gynecology | Admitting: Obstetrics and Gynecology

## 2016-09-01 ENCOUNTER — Encounter (HOSPITAL_COMMUNITY): Payer: Self-pay

## 2016-09-01 DIAGNOSIS — O99332 Smoking (tobacco) complicating pregnancy, second trimester: Secondary | ICD-10-CM | POA: Insufficient documentation

## 2016-09-01 DIAGNOSIS — R102 Pelvic and perineal pain: Secondary | ICD-10-CM | POA: Diagnosis not present

## 2016-09-01 DIAGNOSIS — O26892 Other specified pregnancy related conditions, second trimester: Secondary | ICD-10-CM | POA: Insufficient documentation

## 2016-09-01 DIAGNOSIS — O99342 Other mental disorders complicating pregnancy, second trimester: Secondary | ICD-10-CM | POA: Diagnosis not present

## 2016-09-01 DIAGNOSIS — N949 Unspecified condition associated with female genital organs and menstrual cycle: Secondary | ICD-10-CM

## 2016-09-01 DIAGNOSIS — Z3A15 15 weeks gestation of pregnancy: Secondary | ICD-10-CM | POA: Insufficient documentation

## 2016-09-01 DIAGNOSIS — F329 Major depressive disorder, single episode, unspecified: Secondary | ICD-10-CM | POA: Insufficient documentation

## 2016-09-01 LAB — URINALYSIS, ROUTINE W REFLEX MICROSCOPIC
Bilirubin Urine: NEGATIVE
GLUCOSE, UA: NEGATIVE mg/dL
Hgb urine dipstick: NEGATIVE
Ketones, ur: NEGATIVE mg/dL
Nitrite: NEGATIVE
PROTEIN: NEGATIVE mg/dL
Specific Gravity, Urine: 1.024 (ref 1.005–1.030)
pH: 7 (ref 5.0–8.0)

## 2016-09-01 NOTE — MAU Note (Signed)
Cramping on rt side, skin on that side is tender to touch.  Has been constipated. No problems with urination

## 2016-09-01 NOTE — Discharge Instructions (Signed)
Round Ligament Pain The round ligament is a cord of muscle and tissue that helps to support the uterus. It can become a source of pain during pregnancy if it becomes stretched or twisted as the baby grows. The pain usually begins in the second trimester of pregnancy, and it can come and go until the baby is delivered. It is not a serious problem, and it does not cause harm to the baby. Round ligament pain is usually a short, sharp, and pinching pain, but it can also be a dull, lingering, and aching pain. The pain is felt in the lower side of the abdomen or in the groin. It usually starts deep in the groin and moves up to the outside of the hip area. Pain can occur with:  A sudden change in position.  Rolling over in bed.  Coughing or sneezing.  Physical activity. Follow these instructions at home: Watch your condition for any changes. Take these steps to help with your pain:  When the pain starts, relax. Then try:  Sitting down.  Flexing your knees up to your abdomen.  Lying on your side with one pillow under your abdomen and another pillow between your legs.  Sitting in a warm bath for 15-20 minutes or until the pain goes away.  Take over-the-counter and prescription medicines only as told by your health care provider.  Move slowly when you sit and stand.  Avoid long walks if they cause pain.  Stop or lessen your physical activities if they cause pain. Contact a health care provider if:  Your pain does not go away with treatment.  You feel pain in your back that you did not have before.  Your medicine is not helping. Get help right away if:  You develop a fever or chills.  You develop uterine contractions.  You develop vaginal bleeding.  You develop nausea or vomiting.  You develop diarrhea.  You have pain when you urinate. This information is not intended to replace advice given to you by your health care provider. Make sure you discuss any questions you have with  your health care provider. Document Released: 03/28/2008 Document Revised: 11/25/2015 Document Reviewed: 08/26/2014 Elsevier Interactive Patient Education  2017 Elsevier Inc.  

## 2016-09-01 NOTE — MAU Provider Note (Signed)
History     CSN: [id number]  Arrival date and time: 09/01/16 1851   First Provider Initiated Contact with Patient 09/01/16 1936      No chief complaint on file.  Karen Mcbride is a 26 y.o. G3P1001 at [redacted]w[redacted]d presenting with 2 day history sharp pulling pain in right lower abdomen and groin. The pain is exacerbated by walking and changing positions. It is relieved by rest. Does not radiatae. No self treatment.  No dysuria, urgency or frequency. She denies cramps, vaginal bleeding, irritative vaginal discharge or leakage of fluid.     OB History  Gravida Para Term Preterm AB Living  3 1 1     1   SAB TAB Ectopic Multiple Live Births          1    # Outcome Date GA Lbr Len/2nd Weight Sex Delivery Anes PTL Lv  3 Current           2 Term 2010     Vag-Spont   LIV  1 Gravida                Past Medical History:  Diagnosis Date  . Depression   . Genital herpes     Past Surgical History:  Procedure Laterality Date  . TONSILLECTOMY      History reviewed. No pertinent family history.  Social History  Substance Use Topics  . Smoking status: Light Tobacco Smoker    Packs/day: 0.25    Years: 4.00    Types: Cigarettes  . Smokeless tobacco: Never Used  . Alcohol use Yes     Comment: occasion    Allergies: No Known Allergies  Prescriptions Prior to Admission  Medication Sig Dispense Refill Last Dose  . Prenatal Vit-Fe Fumarate-FA (PRENATAL VITAMIN) 27-0.8 MG TABS Take 1 tablet by mouth daily. 90 tablet 5 09/01/2016 at Unknown time    Review of Systems  Constitutional: Negative for fever.  Gastrointestinal: Positive for abdominal pain. Negative for abdominal distention, constipation, diarrhea, nausea and vomiting.  Genitourinary: Negative for dysuria, flank pain, frequency, urgency and vaginal bleeding.  Musculoskeletal: Negative for back pain.  Psychiatric/Behavioral: The patient is not nervous/anxious.    Physical Exam   Blood pressure 107/55, pulse 90,  temperature 97.9 F (36.6 C), temperature source Oral, resp. rate 16, weight 66.6 kg (146 lb 12 oz), last menstrual period 05/17/2016, SpO2 100 %, unknown if currently breastfeeding.  Physical Exam  Nursing note and vitals reviewed. Constitutional: She is oriented to person, place, and time. She appears well-developed and well-nourished. No distress.  HENT:  Head: Normocephalic.  Eyes: Pupils are equal, round, and reactive to light.  Neck: Normal range of motion.  Cardiovascular: Normal rate.   Respiratory: Effort normal.  GI: Soft. There is tenderness. There is no rebound and no guarding.  Mild right groin TTP  Genitourinary:  Genitourinary Comments: Cx: long, closed  Musculoskeletal: Normal range of motion.  Neurological: She is alert and oriented to person, place, and time.  Skin: Skin is warm and dry.  Psychiatric: She has a normal mood and affect. Her behavior is normal. Thought content normal.    MAU Course  Procedures Results for orders placed or performed during the hospital encounter of 09/01/16 (from the past 24 hour(s))  Urinalysis, Routine w reflex microscopic     Status: Abnormal   Collection Time: 09/01/16  7:05 PM  Result Value Ref Range   Color, Urine YELLOW YELLOW   APPearance CLOUDY (A) CLEAR   Specific Gravity,  Urine 1.024 1.005 - 1.030   pH 7.0 5.0 - 8.0   Glucose, UA NEGATIVE NEGATIVE mg/dL   Hgb urine dipstick NEGATIVE NEGATIVE   Bilirubin Urine NEGATIVE NEGATIVE   Ketones, ur NEGATIVE NEGATIVE mg/dL   Protein, ur NEGATIVE NEGATIVE mg/dL   Nitrite NEGATIVE NEGATIVE   Leukocytes, UA SMALL (A) NEGATIVE   RBC / HPF 0-5 0 - 5 RBC/hpf   WBC, UA 0-5 0 - 5 WBC/hpf   Bacteria, UA RARE (A) NONE SEEN   Squamous Epithelial / LPF 6-30 (A) NONE SEEN   Mucous PRESENT      Assessment and Plan   1. Round ligament pain    Allergies as of 09/01/2016   No Known Allergies     Medication List    TAKE these medications   Prenatal Vitamin 27-0.8 MG Tabs Take 1  tablet by mouth daily.      Follow-up [facility] for [facility] Follow up.   Specialty:  Obstetrics and Gynecology Why:  Keep you her scheduled appointment Contact information: [place] [phone]          Khadeem Rockett 09/01/2016, 7:36 PM

## 2016-09-21 ENCOUNTER — Encounter: Payer: Self-pay | Admitting: Certified Nurse Midwife

## 2016-09-21 ENCOUNTER — Other Ambulatory Visit (HOSPITAL_COMMUNITY)
Admission: RE | Admit: 2016-09-21 | Discharge: 2016-09-21 | Disposition: A | Discharge status: Home / Self Care | Payer: Medicaid Other | Source: Ambulatory Visit | Attending: Certified Nurse Midwife | Admitting: Certified Nurse Midwife

## 2016-09-21 ENCOUNTER — Ambulatory Visit (INDEPENDENT_AMBULATORY_CARE_PROVIDER_SITE_OTHER): Payer: Medicaid Other | Admitting: Certified Nurse Midwife

## 2016-09-21 DIAGNOSIS — Z3480 Encounter for supervision of other normal pregnancy, unspecified trimester: Secondary | ICD-10-CM | POA: Diagnosis not present

## 2016-09-21 DIAGNOSIS — O0930 Supervision of pregnancy with insufficient antenatal care, unspecified trimester: Secondary | ICD-10-CM

## 2016-09-21 DIAGNOSIS — Z3482 Encounter for supervision of other normal pregnancy, second trimester: Secondary | ICD-10-CM | POA: Insufficient documentation

## 2016-09-21 DIAGNOSIS — Z3490 Encounter for supervision of normal pregnancy, unspecified, unspecified trimester: Secondary | ICD-10-CM | POA: Insufficient documentation

## 2016-09-21 DIAGNOSIS — O0932 Supervision of pregnancy with insufficient antenatal care, second trimester: Secondary | ICD-10-CM

## 2016-09-21 DIAGNOSIS — A6000 Herpesviral infection of urogenital system, unspecified: Secondary | ICD-10-CM | POA: Insufficient documentation

## 2016-09-21 MED ORDER — PRENATE PIXIE 10-0.6-0.4-200 MG PO CAPS: 1.0000 | ORAL_CAPSULE | Freq: Every day | ORAL | 12 refills | Status: DC

## 2016-09-21 NOTE — Progress Notes (Signed)
Patient is in the office for initial ob visit.

## 2016-09-21 NOTE — Progress Notes (Signed)
Subjective:    Karen Mcbride is being seen today for her first obstetrical visit.  This is a planned pregnancy. She is at [redacted]w[redacted]d gestation. Her obstetrical history is significant for none. Relationship with FOB: spouse, living together. Patient does intend to breast feed. Pregnancy history fully reviewed.  The information documented in the HPI was reviewed and verified.  Menstrual History: OB History    Gravida Para Term Preterm AB Living   2 1 1     1    SAB TAB Ectopic Multiple Live Births           1       Patient's last menstrual period was 05/17/2016.    Past Medical History:  Diagnosis Date  . Depression   . Genital herpes     Past Surgical History:  Procedure Laterality Date  . TONSILLECTOMY       (Not in a hospital admission) No Known Allergies  Social History  Substance Use Topics  . Smoking status: Light Tobacco Smoker    Packs/day: 0.25    Years: 4.00    Types: Cigarettes  . Smokeless tobacco: Never Used  . Alcohol use Yes     Comment: occasion    Family History  Problem Relation Age of Onset  . Diabetes Maternal Aunt   . Diabetes Maternal Grandfather      Review of Systems Constitutional: negative for weight loss Gastrointestinal: negative for vomiting Genitourinary:negative for genital lesions and vaginal discharge and dysuria Musculoskeletal:negative for back pain Behavioral/Psych: negative for abusive relationship, depression, illegal drug usage and tobacco use    Objective:    BP 106/68   Pulse 90   Temp 97.4 F (36.3 C)   Wt 151 lb 3.2 oz (68.6 kg)   LMP 05/17/2016   BMI 26.78 kg/m  General Appearance:    Alert, cooperative, no distress, appears stated age  Head:    Normocephalic, without obvious abnormality, atraumatic  Eyes:    PERRL, conjunctiva/corneas clear, EOM's intact, fundi    benign, both eyes  Ears:    Normal TM's and external ear canals, both ears  Nose:   Nares normal, septum midline, mucosa normal, no drainage    or  sinus tenderness  Throat:   Lips, mucosa, and tongue normal; teeth and gums normal  Neck:   Supple, symmetrical, trachea midline, no adenopathy;    thyroid:  no enlargement/tenderness/nodules; no carotid   bruit or JVD  Back:     Symmetric, no curvature, ROM normal, no CVA tenderness  Lungs:     Clear to auscultation bilaterally, respirations unlabored  Chest Wall:    No tenderness or deformity   Heart:    Regular rate and rhythm, S1 and S2 normal, no murmur, rub   or gallop  Breast Exam:    No tenderness, masses, or nipple abnormality  Abdomen:     Soft, non-tender, bowel sounds active all four quadrants,    no masses, no organomegaly  Genitalia:    Normal female without lesion, discharge or tenderness  Extremities:   Extremities normal, atraumatic, no cyanosis or edema  Pulses:   2+ and symmetric all extremities  Skin:   Skin color, texture, turgor normal, no rashes or lesions  Lymph nodes:   Cervical, supraclavicular, and axillary nodes normal  Neurologic:   CNII-XII intact, normal strength, sensation and reflexes    throughout      Cerivix: long, thick, closed and posterior.  FH: @U .  FHR: 140 by doppler.  Lab Review Urine pregnancy test Labs reviewed yes Radiologic studies reviewed yes Assessment:    Pregnancy at [redacted]w[redacted]d weeks   Supervision of other normal pregnancy, antepartum - Plan: Cytology - PAP, Cervicovaginal ancillary only, Hemoglobinopathy evaluation, Vitamin D (25 hydroxy), Varicella zoster antibody, IgG, Culture, OB Urine, Cystic Fibrosis Mutation 97, Obstetric Panel, Including HIV, MaterniT21 PLUS Core, [place] MFM OB COMP + 14 WK, HgB A1c, AFP, Serum, Open Spina Bifida, Prenat-FeAsp-Meth-FA-DHA w/o A (PRENATE PIXIE) 10-0.6-0.4-200 MG CAPS  Late prenatal care - Plan: [place] MFM OB COMP + 14 WK   Plan:      Prenatal vitamins.  Counseling provided regarding continued use of seat belts, cessation of alcohol consumption, smoking or use of illicit drugs; infection  precautions i.e., influenza/TDAP immunizations, toxoplasmosis,CMV, parvovirus, listeria and varicella; workplace safety, exercise during pregnancy; routine dental care, safe medications, sexual activity, hot tubs, saunas, pools, travel, caffeine use, fish and methlymercury, potential toxins, hair treatments, varicose veins Weight gain recommendations per IOM guidelines reviewed: underweight/BMI< 18.5--> gain 28 - 40 lbs; normal weight/BMI 18.5 - 24.9--> gain 25 - 35 lbs; overweight/BMI 25 - 29.9--> gain 15 - 25 lbs; obese/BMI >30->gain  11 - 20 lbs Problem list reviewed and updated. FIRST/CF mutation testing/NIPT/QUAD SCREEN/fragile X/Ashkenazi Jewish population testing/Spinal muscular atrophy discussed: ordered. Role of ultrasound in pregnancy discussed; fetal survey: ordered. Amniocentesis discussed: not indicated.  Meds ordered this encounter  Medications  . Prenatal MV-Min-FA-Omega-3 (PRENATAL GUMMIES/DHA & FA) 0.4-32.5 MG CHEW    Sig: Chew by mouth.  . Prenat-FeAsp-Meth-FA-DHA w/o A (PRENATE PIXIE) 10-0.6-0.4-200 MG CAPS    Sig: Take 1 tablet by mouth daily.    Dispense:  30 capsule    Refill:  12    Please process coupon: Rx BIN: [id number], RxPCN: [id number], RxGRP: [id number], RxID: [id number]  SUF: 01   Orders Placed This Encounter  Procedures  . Culture, OB Urine  . [place] MFM OB COMP + 14 WK    Standing Status:   Future    Standing Expiration Date:   11/21/2017    Order Specific Question:   Reason for Exam (SYMPTOM  OR DIAGNOSIS REQUIRED)    Answer:   anatomy scan    Order Specific Question:   Preferred imaging location?    Answer:   [facility]Ul[facility]ound  . Hemoglobinopathy evaluation  . Vitamin D (25 hydroxy)  . Varicella zoster antibody, IgG  . Cystic Fibrosis Mutation 97  . Obstetric Panel, Including HIV  . MaterniT21 PLUS Core    Order Specific Question:   Is the patient insulin dependent?    Answer:   No    Order Specific Question:   Please enter gestational age. This should be  expressed as weeks AND days, i.e. 16w 6d. Enter weeks here. Enter days in next question.    Answer:   70    Order Specific Question:   Please enter gestational age. This should be expressed as weeks AND days, i.e. 16w 6d. Enter days here. Enter weeks in previous question.    Answer:   1    Order Specific Question:   How was gestational age calculated?    Answer:   Ultrasound    Order Specific Question:   Please give the date of LMP OR Ultrasound OR Estimated date of delivery.    Answer:   02/21/2017    Order Specific Question:   Number of Fetuses (Type of Pregnancy):    Answer:   1    Order Specific Question:   Indications for performing  the test? (please choose all that apply):    Answer:   Routine screening    Order Specific Question:   Other Indications? (Y=Yes, N=No)    Answer:   N    Order Specific Question:   If this is a repeat specimen, please indicate the reason:    Answer:   Not indicated    Order Specific Question:   Please specify the patient's race: (C=White/Caucasion, B=Black, I=Native American, A=Asian, H=Hispanic, O=Other, U=Unknown)    Answer:   B    Order Specific Question:   Donor Egg - indicate if the egg was obtained from in vitro fertilization.    Answer:   N    Order Specific Question:   Age of Egg Donor.    Answer:   77    Order Specific Question:   Prior Down Syndrome/ONTD screening during current pregnancy.    Answer:   N    Order Specific Question:   Prior First Trimester Testing    Answer:   N    Order Specific Question:   Prior Second Trimester Testing    Answer:   N    Order Specific Question:   Family History of Neural Tube Defects    Answer:   N    Order Specific Question:   Prior Pregnancy with Down Syndrome    Answer:   N    Order Specific Question:   Please give the patient's weight (in pounds)    Answer:   151  . HgB A1c  . AFP, Serum, Open Spina Bifida    Order Specific Question:   Is patient insulin dependent?    Answer:   No    Order  Specific Question:   Patient weight (lb.)    Answer:   151 lb 3.2 oz (68.6 kg)    Order Specific Question:   Gestational Age (GA), weeks    Answer:   41.1    Order Specific Question:   Date on which patient was at this [place]    Answer:   09/21/2016    Order Specific Question:   GA Calculation Method    Answer:   Ultrasound    Order Specific Question:   GA Date    Answer:   02/21/2017    Order Specific Question:   Donor egg?    Answer:   N    Order Specific Question:   Age of egg donor?    Answer:   25    Follow up in 4 weeks. 50% of 30 min visit spent on counseling and coordination of care.

## 2016-09-22 LAB — CERVICOVAGINAL ANCILLARY ONLY
Bacterial vaginitis: POSITIVE — AB
CANDIDA VAGINITIS: NEGATIVE
Chlamydia: NEGATIVE
NEISSERIA GONORRHEA: NEGATIVE
TRICH (WINDOWPATH): NEGATIVE

## 2016-09-22 LAB — CYTOLOGY - PAP: Diagnosis: NEGATIVE

## 2016-09-23 LAB — AFP, SERUM, OPEN SPINA BIFIDA
AFP MoM: 1.04
AFP Value: 49.4 ng/mL
GEST. AGE ON COLLECTION DATE: 18.1 wk
MATERNAL AGE AT EDD: 27.3 a
OSBR Risk 1 IN: 10000
Test Results:: NEGATIVE
WEIGHT: 151 [lb_av]

## 2016-09-25 ENCOUNTER — Other Ambulatory Visit: Payer: Self-pay | Admitting: Certified Nurse Midwife

## 2016-09-25 DIAGNOSIS — B9689 Other specified bacterial agents as the cause of diseases classified elsewhere: Secondary | ICD-10-CM

## 2016-09-25 DIAGNOSIS — N760 Acute vaginitis: Secondary | ICD-10-CM

## 2016-09-25 DIAGNOSIS — Z3480 Encounter for supervision of other normal pregnancy, unspecified trimester: Secondary | ICD-10-CM

## 2016-09-25 LAB — CULTURE, OB URINE

## 2016-09-25 LAB — URINE CULTURE, OB REFLEX

## 2016-09-25 MED ORDER — METRONIDAZOLE 0.75 % VA GEL: 1.0000 | Freq: Two times a day (BID) | VAGINAL | 0 refills | Status: DC

## 2016-09-26 ENCOUNTER — Ambulatory Visit (HOSPITAL_COMMUNITY)
Admission: RE | Admit: 2016-09-26 | Discharge: 2016-09-26 | Disposition: A | Discharge status: Home / Self Care | Payer: Medicaid Other | Source: Ambulatory Visit | Attending: Certified Nurse Midwife | Admitting: Certified Nurse Midwife

## 2016-09-26 ENCOUNTER — Other Ambulatory Visit: Payer: Self-pay | Admitting: Certified Nurse Midwife

## 2016-09-26 DIAGNOSIS — O0932 Supervision of pregnancy with insufficient antenatal care, second trimester: Secondary | ICD-10-CM

## 2016-09-26 DIAGNOSIS — Z363 Encounter for antenatal screening for malformations: Secondary | ICD-10-CM

## 2016-09-26 DIAGNOSIS — B009 Herpesviral infection, unspecified: Secondary | ICD-10-CM | POA: Insufficient documentation

## 2016-09-26 DIAGNOSIS — Z3A18 18 weeks gestation of pregnancy: Secondary | ICD-10-CM | POA: Diagnosis not present

## 2016-09-26 DIAGNOSIS — O98512 Other viral diseases complicating pregnancy, second trimester: Secondary | ICD-10-CM | POA: Insufficient documentation

## 2016-09-26 DIAGNOSIS — O99332 Smoking (tobacco) complicating pregnancy, second trimester: Secondary | ICD-10-CM | POA: Diagnosis not present

## 2016-09-26 DIAGNOSIS — Z3480 Encounter for supervision of other normal pregnancy, unspecified trimester: Secondary | ICD-10-CM

## 2016-09-26 DIAGNOSIS — O0930 Supervision of pregnancy with insufficient antenatal care, unspecified trimester: Secondary | ICD-10-CM

## 2016-09-26 LAB — OBSTETRIC PANEL, INCLUDING HIV
ANTIBODY SCREEN: NEGATIVE
BASOS: 0 %
Basophils Absolute: 0 10*3/uL (ref 0.0–0.2)
EOS (ABSOLUTE): 0.1 10*3/uL (ref 0.0–0.4)
EOS: 1 %
HEMATOCRIT: 33.6 % — AB (ref 34.0–46.6)
HEP B S AG: NEGATIVE
HIV SCREEN 4TH GENERATION: NONREACTIVE
Hemoglobin: 11.4 g/dL (ref 11.1–15.9)
Immature Grans (Abs): 0 10*3/uL (ref 0.0–0.1)
Immature Granulocytes: 0 %
Lymphocytes Absolute: 1.4 10*3/uL (ref 0.7–3.1)
Lymphs: 22 %
MCH: 30.6 pg (ref 26.6–33.0)
MCHC: 33.9 g/dL (ref 31.5–35.7)
MCV: 90 fL (ref 79–97)
MONOCYTES: 4 %
Monocytes Absolute: 0.3 10*3/uL (ref 0.1–0.9)
NEUTROS ABS: 4.7 10*3/uL (ref 1.4–7.0)
Neutrophils: 73 %
Platelets: 206 10*3/uL (ref 150–379)
RBC: 3.73 x10E6/uL — ABNORMAL LOW (ref 3.77–5.28)
RDW: 14.1 % (ref 12.3–15.4)
RPR: NONREACTIVE
RUBELLA: 7 {index} (ref >0.99)
Rh Factor: POSITIVE
WBC: 6.5 10*3/uL (ref 3.4–10.8)

## 2016-09-26 LAB — HEMOGLOBIN A1C
Est. average glucose Bld gHb Est-mCnc: 100 mg/dL
Hgb A1c MFr Bld: 5.1 % (ref 4.8–5.6)

## 2016-09-26 LAB — HEMOGLOBINOPATHY EVALUATION
HEMOGLOBIN A2 QUANTITATION: 2.3 % (ref 1.8–3.2)
HGB C: 0 %
HGB S: 0 %
HGB VARIANT: 0 %
Hemoglobin F Quantitation: 0 % (ref 0.0–2.0)
Hgb A: 97.7 % (ref 96.4–98.8)

## 2016-09-26 LAB — VITAMIN D 25 HYDROXY (VIT D DEFICIENCY, FRACTURES): Vit D, 25-Hydroxy: 15.4 ng/mL — ABNORMAL LOW (ref 30.0–100.0)

## 2016-09-26 LAB — VARICELLA ZOSTER ANTIBODY, IGG: VARICELLA: 1311 {index} (ref >165)

## 2016-09-27 ENCOUNTER — Encounter: Payer: Self-pay | Admitting: Obstetrics & Gynecology

## 2016-09-28 ENCOUNTER — Other Ambulatory Visit: Payer: Self-pay | Admitting: Certified Nurse Midwife

## 2016-09-28 DIAGNOSIS — Z3480 Encounter for supervision of other normal pregnancy, unspecified trimester: Secondary | ICD-10-CM

## 2016-09-28 DIAGNOSIS — R7989 Other specified abnormal findings of blood chemistry: Secondary | ICD-10-CM

## 2016-09-28 LAB — CYSTIC FIBROSIS MUTATION 97: Interpretation: NOT DETECTED

## 2016-09-28 MED ORDER — VITAMIN D (ERGOCALCIFEROL) 1.25 MG (50000 UNIT) PO CAPS
50000.0000 [IU] | ORAL_CAPSULE | ORAL | 2 refills | Status: DC
Start: 2016-09-28 — End: 2017-04-02

## 2016-10-01 LAB — MATERNIT 21 PLUS CORE, BLOOD
Chromosome 13: NEGATIVE
Chromosome 18: NEGATIVE
Chromosome 21: NEGATIVE
Y CHROMOSOME: NOT DETECTED

## 2016-10-02 ENCOUNTER — Other Ambulatory Visit: Payer: Self-pay | Admitting: Certified Nurse Midwife

## 2016-10-02 DIAGNOSIS — Z3480 Encounter for supervision of other normal pregnancy, unspecified trimester: Secondary | ICD-10-CM

## 2016-10-04 ENCOUNTER — Telehealth: Payer: Self-pay | Admitting: *Deleted

## 2016-10-04 ENCOUNTER — Encounter: Payer: Self-pay | Admitting: *Deleted

## 2016-10-04 NOTE — Telephone Encounter (Signed)
No restrictions, please continue with the letter production.  Thank you.  R.Daralyn Bert CNM

## 2016-10-04 NOTE — Telephone Encounter (Signed)
Pt aware available for pick up.

## 2016-10-04 NOTE — Telephone Encounter (Signed)
Pt called to office stating she needs a letter for school.  Return call to pt. 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  Pt states no strenuous activities should occur. Pt made aware message will be sent to provider for approval for clinicals/letter.    Please advise on externship / any restrictions.

## 2016-10-04 NOTE — Telephone Encounter (Signed)
Letter composed and will be left at front desk for pick up.

## 2016-10-19 ENCOUNTER — Encounter: Payer: Self-pay | Admitting: Certified Nurse Midwife

## 2016-10-19 ENCOUNTER — Ambulatory Visit (INDEPENDENT_AMBULATORY_CARE_PROVIDER_SITE_OTHER): Payer: Medicaid Other | Admitting: Certified Nurse Midwife

## 2016-10-19 VITALS — BP 128/67 | HR 111 | Wt 152.0 lb

## 2016-10-19 DIAGNOSIS — Z3480 Encounter for supervision of other normal pregnancy, unspecified trimester: Secondary | ICD-10-CM

## 2016-10-19 DIAGNOSIS — A6004 Herpesviral vulvovaginitis: Secondary | ICD-10-CM

## 2016-10-19 DIAGNOSIS — Z3482 Encounter for supervision of other normal pregnancy, second trimester: Secondary | ICD-10-CM

## 2016-10-19 DIAGNOSIS — R7989 Other specified abnormal findings of blood chemistry: Secondary | ICD-10-CM

## 2016-10-19 DIAGNOSIS — E559 Vitamin D deficiency, unspecified: Secondary | ICD-10-CM

## 2016-10-19 NOTE — Progress Notes (Signed)
   PRENATAL VISIT NOTE  Subjective:  Karen Mcbride is a 26 y.o. G2P1001 at [redacted]w[redacted]d being seen today for ongoing prenatal care.  She is currently monitored for the following issues for this low-risk pregnancy and has Supervision of normal pregnancy, antepartum; Genital herpes; and Low vitamin D level on her problem list.  Patient reports no complaints.  Contractions: Not present. Vag. Bleeding: None.  Movement: Present. Denies leaking of fluid.   The following portions of the patient's history were reviewed and updated as appropriate: allergies, current medications, past family history, past medical history, past social history, past surgical history and problem list. Problem list updated.  Objective:   Vitals:   10/19/16 0827  BP: 128/67  Pulse: (!) 111  Weight: 152 lb (68.9 kg)    Fetal Status: Fetal Heart Rate (bpm): 148 Fundal Height: 22 cm Movement: Present     General:  Alert, oriented and cooperative. Patient is in no acute distress.  Skin: Skin is warm and dry. No rash noted.   Cardiovascular: Normal heart rate noted  Respiratory: Normal respiratory effort, no problems with respiration noted  Abdomen: Soft, gravid, appropriate for gestational age. Pain/Pressure: Absent     Pelvic:  Cervical exam deferred        Extremities: Normal range of motion.  Edema: None  Mental Status: Normal mood and affect. Normal behavior. Normal judgment and thought content.   Assessment and Plan:  Pregnancy: G2P1001 at [redacted]w[redacted]d  1. Herpes simplex vulvovaginitis     Valtrex @36  weeks  2. Supervision of other normal pregnancy, antepartum      Doing well  3. Low vitamin D level     Taking weekly vitamin D  Preterm labor symptoms and general obstetric precautions including but not limited to vaginal bleeding, contractions, leaking of fluid and fetal movement were reviewed in detail with the patient. Please refer to After Visit Summary for other counseling recommendations.  Return in about 4  weeks (around 11/16/2016) for [place].   Morene Crocker, CNM

## 2016-10-30 ENCOUNTER — Ambulatory Visit (HOSPITAL_COMMUNITY)
Admission: RE | Admit: 2016-10-30 | Discharge: 2016-10-30 | Disposition: A | Discharge status: Home / Self Care | Payer: Medicaid Other | Source: Ambulatory Visit | Attending: Certified Nurse Midwife | Admitting: Certified Nurse Midwife

## 2016-10-30 DIAGNOSIS — Z3A23 23 weeks gestation of pregnancy: Secondary | ICD-10-CM | POA: Insufficient documentation

## 2016-10-30 DIAGNOSIS — Z3480 Encounter for supervision of other normal pregnancy, unspecified trimester: Secondary | ICD-10-CM

## 2016-11-02 ENCOUNTER — Other Ambulatory Visit: Payer: Self-pay | Admitting: Certified Nurse Midwife

## 2016-11-02 DIAGNOSIS — Z3480 Encounter for supervision of other normal pregnancy, unspecified trimester: Secondary | ICD-10-CM

## 2016-11-16 ENCOUNTER — Encounter: Payer: Self-pay | Admitting: Certified Nurse Midwife

## 2016-11-16 ENCOUNTER — Ambulatory Visit (INDEPENDENT_AMBULATORY_CARE_PROVIDER_SITE_OTHER): Payer: Medicaid Other | Admitting: Certified Nurse Midwife

## 2016-11-16 VITALS — BP 106/68 | HR 108 | Wt 155.2 lb

## 2016-11-16 DIAGNOSIS — O98312 Other infections with a predominantly sexual mode of transmission complicating pregnancy, second trimester: Secondary | ICD-10-CM

## 2016-11-16 DIAGNOSIS — A6004 Herpesviral vulvovaginitis: Secondary | ICD-10-CM

## 2016-11-16 DIAGNOSIS — Z3480 Encounter for supervision of other normal pregnancy, unspecified trimester: Secondary | ICD-10-CM

## 2016-11-16 DIAGNOSIS — R7989 Other specified abnormal findings of blood chemistry: Secondary | ICD-10-CM

## 2016-11-16 DIAGNOSIS — Z3482 Encounter for supervision of other normal pregnancy, second trimester: Secondary | ICD-10-CM

## 2016-11-16 DIAGNOSIS — E559 Vitamin D deficiency, unspecified: Secondary | ICD-10-CM

## 2016-11-16 NOTE — Progress Notes (Signed)
Patient reports good fetal movement, complains of feeling pressure when she changes from lying to standing position, denies contractions and bleeding.

## 2016-11-16 NOTE — Progress Notes (Signed)
   PRENATAL VISIT NOTE  Subjective:  Karen Mcbride is a 26 y.o. G2P1001 at [redacted]w[redacted]d being seen today for ongoing prenatal care.  She is currently monitored for the following issues for this low-risk pregnancy and has Supervision of normal pregnancy, antepartum; Genital herpes; and Low vitamin D level on her problem list.  Patient reports no complaints.  Contractions: Not present. Vag. Bleeding: None.  Movement: Present. Denies leaking of fluid.   The following portions of the patient's history were reviewed and updated as appropriate: allergies, current medications, past family history, past medical history, past social history, past surgical history and problem list. Problem list updated.  Objective:   Vitals:   11/16/16 0819 11/16/16 0823  BP: 139/80 106/68  Pulse: (!) 128 (!) 108  Weight: 155 lb 3.2 oz (70.4 kg)     Fetal Status: Fetal Heart Rate (bpm): 132 Fundal Height: 25 cm Movement: Present     General:  Alert, oriented and cooperative. Patient is in no acute distress.  Skin: Skin is warm and dry. No rash noted.   Cardiovascular: Normal heart rate noted  Respiratory: Normal respiratory effort, no problems with respiration noted  Abdomen: Soft, gravid, appropriate for gestational age. Pain/Pressure: Present     Pelvic:  Cervical exam deferred        Extremities: Normal range of motion.  Edema: Trace  Mental Status: Normal mood and affect. Normal behavior. Normal judgment and thought content.   Assessment and Plan:  Pregnancy: G2P1001 at [redacted]w[redacted]d  1. Supervision of other normal pregnancy, antepartum      Doing well.  Does have acne.    2. Herpes simplex vulvovaginitis     Valtrex @36  weeks  3. Low vitamin D level    Taking weekly vitmain D.   Preterm labor symptoms and general obstetric precautions including but not limited to vaginal bleeding, contractions, leaking of fluid and fetal movement were reviewed in detail with the patient. Please refer to After Visit Summary  for other counseling recommendations.  Return in about 2 weeks (around 11/30/2016) for ROB, 2 hr OGTT.   Morene Crocker, CNM

## 2016-12-01 ENCOUNTER — Ambulatory Visit (INDEPENDENT_AMBULATORY_CARE_PROVIDER_SITE_OTHER): Payer: Medicaid Other | Admitting: Certified Nurse Midwife

## 2016-12-01 ENCOUNTER — Other Ambulatory Visit: Payer: Medicaid Other

## 2016-12-01 VITALS — BP 114/73 | HR 93 | Wt 159.0 lb

## 2016-12-01 DIAGNOSIS — R7989 Other specified abnormal findings of blood chemistry: Secondary | ICD-10-CM

## 2016-12-01 DIAGNOSIS — O9989 Other specified diseases and conditions complicating pregnancy, childbirth and the puerperium: Secondary | ICD-10-CM

## 2016-12-01 DIAGNOSIS — E559 Vitamin D deficiency, unspecified: Secondary | ICD-10-CM

## 2016-12-01 DIAGNOSIS — O26893 Other specified pregnancy related conditions, third trimester: Secondary | ICD-10-CM

## 2016-12-01 DIAGNOSIS — Z3483 Encounter for supervision of other normal pregnancy, third trimester: Secondary | ICD-10-CM

## 2016-12-01 DIAGNOSIS — A6004 Herpesviral vulvovaginitis: Secondary | ICD-10-CM

## 2016-12-01 DIAGNOSIS — M549 Dorsalgia, unspecified: Secondary | ICD-10-CM

## 2016-12-01 DIAGNOSIS — Z3480 Encounter for supervision of other normal pregnancy, unspecified trimester: Secondary | ICD-10-CM

## 2016-12-01 MED ORDER — COMFORT FIT MATERNITY SUPP LG MISC: 1.0000 [IU] | Freq: Every day | 0 refills | Status: DC

## 2016-12-01 NOTE — Progress Notes (Signed)
   PRENATAL VISIT NOTE  Subjective:  Karen Mcbride is a 26 y.o. G2P1001 at [redacted]w[redacted]d being seen today for ongoing prenatal care.  She is currently monitored for the following issues for this low-risk pregnancy and has Supervision of normal pregnancy, antepartum; Genital herpes; and Low vitamin D level on her problem list.  Patient reports no complaints.  Contractions: Irritability. Vag. Bleeding: None.  Movement: Present. Denies leaking of fluid.   The following portions of the patient's history were reviewed and updated as appropriate: allergies, current medications, past family history, past medical history, past social history, past surgical history and problem list. Problem list updated.  Objective:   Vitals:   12/01/16 0916  BP: 114/73  Pulse: 93  Weight: 159 lb (72.1 kg)    Fetal Status:     Movement: Present     General:  Alert, oriented and cooperative. Patient is in no acute distress.  Skin: Skin is warm and dry. No rash noted.   Cardiovascular: Normal heart rate noted  Respiratory: Normal respiratory effort, no problems with respiration noted  Abdomen: Soft, gravid, appropriate for gestational age. Pain/Pressure: Present     Pelvic:  Cervical exam deferred        Extremities: Normal range of motion.  Edema: Trace  Mental Status: Normal mood and affect. Normal behavior. Normal judgment and thought content.   Assessment and Plan:  Pregnancy: G2P1001 at [redacted]w[redacted]d  1. Supervision of other normal pregnancy, antepartum     Doing well.  - Glucose Tolerance, 2 Hours w/1 Hour - HIV antibody - RPR - CBC  2. Low vitamin D level     Taking vitamin D  3. Herpes simplex vulvovaginitis     Valtrex @36  weeks    Preterm labor symptoms and general obstetric precautions including but not limited to vaginal bleeding, contractions, leaking of fluid and fetal movement were reviewed in detail with the patient. Please refer to After Visit Summary for other counseling recommendations.    Return in about 2 weeks (around 12/15/2016) for [place].   Morene Crocker, CNM

## 2016-12-02 LAB — GLUCOSE TOLERANCE, 2 HOURS W/ 1HR
Glucose, 1 hour: 125 mg/dL (ref 65–179)
Glucose, 2 hour: 122 mg/dL (ref 65–152)
Glucose, Fasting: 81 mg/dL (ref 65–91)

## 2016-12-02 LAB — CBC
HEMATOCRIT: 32.6 % — AB (ref 34.0–46.6)
HEMOGLOBIN: 10.8 g/dL — AB (ref 11.1–15.9)
MCH: 30.2 pg (ref 26.6–33.0)
MCHC: 33.1 g/dL (ref 31.5–35.7)
MCV: 91 fL (ref 79–97)
Platelets: 178 10*3/uL (ref 150–379)
RBC: 3.58 x10E6/uL — AB (ref 3.77–5.28)
RDW: 13.8 % (ref 12.3–15.4)
WBC: 5.6 10*3/uL (ref 3.4–10.8)

## 2016-12-02 LAB — RPR: RPR Ser Ql: NONREACTIVE

## 2016-12-02 LAB — HIV ANTIBODY (ROUTINE TESTING W REFLEX): HIV Screen 4th Generation wRfx: NONREACTIVE

## 2016-12-04 ENCOUNTER — Other Ambulatory Visit: Payer: Self-pay | Admitting: Certified Nurse Midwife

## 2016-12-04 DIAGNOSIS — O99019 Anemia complicating pregnancy, unspecified trimester: Secondary | ICD-10-CM | POA: Insufficient documentation

## 2016-12-04 DIAGNOSIS — O99013 Anemia complicating pregnancy, third trimester: Secondary | ICD-10-CM

## 2016-12-04 DIAGNOSIS — Z3480 Encounter for supervision of other normal pregnancy, unspecified trimester: Secondary | ICD-10-CM

## 2016-12-04 MED ORDER — CITRANATAL BLOOM 90-1 MG PO TABS: 1.0000 | ORAL_TABLET | Freq: Every day | ORAL | 12 refills | Status: DC

## 2016-12-13 ENCOUNTER — Encounter: Payer: Self-pay | Admitting: Certified Nurse Midwife

## 2016-12-13 ENCOUNTER — Ambulatory Visit (INDEPENDENT_AMBULATORY_CARE_PROVIDER_SITE_OTHER): Payer: Medicaid Other | Admitting: Certified Nurse Midwife

## 2016-12-13 VITALS — BP 116/68 | HR 96 | Wt 157.6 lb

## 2016-12-13 DIAGNOSIS — R7989 Other specified abnormal findings of blood chemistry: Secondary | ICD-10-CM

## 2016-12-13 DIAGNOSIS — Z3483 Encounter for supervision of other normal pregnancy, third trimester: Secondary | ICD-10-CM

## 2016-12-13 DIAGNOSIS — O99013 Anemia complicating pregnancy, third trimester: Secondary | ICD-10-CM

## 2016-12-13 DIAGNOSIS — Z3480 Encounter for supervision of other normal pregnancy, unspecified trimester: Secondary | ICD-10-CM

## 2016-12-13 NOTE — Progress Notes (Signed)
   PRENATAL VISIT NOTE  Subjective:  Karen Mcbride is a 26 y.o. G2P1001 at [redacted]w[redacted]d being seen today for ongoing prenatal care.  She is currently monitored for the following issues for this low-risk pregnancy and has Supervision of normal pregnancy, antepartum; Genital herpes; Low vitamin D level; and Anemia complicating pregnancy on her problem list.  Patient reports no complaints.  Contractions: Irritability. Vag. Bleeding: None.  Movement: Present. Denies leaking of fluid.   The following portions of the patient's history were reviewed and updated as appropriate: allergies, current medications, past family history, past medical history, past social history, past surgical history and problem list. Problem list updated.  Objective:   Vitals:   12/13/16 1502  BP: 116/68  Pulse: 96  Weight: 157 lb 9.6 oz (71.5 kg)    Fetal Status: Fetal Heart Rate (bpm): 147 Fundal Height: 30 cm Movement: Present     General:  Alert, oriented and cooperative. Patient is in no acute distress.  Skin: Skin is warm and dry. No rash noted.   Cardiovascular: Normal heart rate noted  Respiratory: Normal respiratory effort, no problems with respiration noted  Abdomen: Soft, gravid, appropriate for gestational age. Pain/Pressure: Present     Pelvic:  Cervical exam deferred        Extremities: Normal range of motion.  Edema: Trace  Mental Status: Normal mood and affect. Normal behavior. Normal judgment and thought content.   Assessment and Plan:  Pregnancy: G2P1001 at [redacted]w[redacted]d  1. Supervision of other normal pregnancy, antepartum     Doing well.   2. Low vitamin D level      Taking weekly vitamin D  3. Anemia affecting pregnancy in third trimester     Taking Bloom  Preterm labor symptoms and general obstetric precautions including but not limited to vaginal bleeding, contractions, leaking of fluid and fetal movement were reviewed in detail with the patient. Please refer to After Visit Summary for other  counseling recommendations.  Return in about 2 weeks (around 12/27/2016) for ROB.   Morene Crocker, CNM

## 2016-12-27 ENCOUNTER — Encounter: Payer: Self-pay | Admitting: Certified Nurse Midwife

## 2016-12-27 ENCOUNTER — Ambulatory Visit (INDEPENDENT_AMBULATORY_CARE_PROVIDER_SITE_OTHER): Payer: Medicaid Other | Admitting: Certified Nurse Midwife

## 2016-12-27 VITALS — BP 116/64 | HR 92 | Wt 157.4 lb

## 2016-12-27 DIAGNOSIS — O9989 Other specified diseases and conditions complicating pregnancy, childbirth and the puerperium: Secondary | ICD-10-CM

## 2016-12-27 DIAGNOSIS — R102 Pelvic and perineal pain: Secondary | ICD-10-CM

## 2016-12-27 DIAGNOSIS — R7989 Other specified abnormal findings of blood chemistry: Secondary | ICD-10-CM

## 2016-12-27 DIAGNOSIS — O26893 Other specified pregnancy related conditions, third trimester: Secondary | ICD-10-CM

## 2016-12-27 DIAGNOSIS — D229 Melanocytic nevi, unspecified: Secondary | ICD-10-CM

## 2016-12-27 DIAGNOSIS — Z3483 Encounter for supervision of other normal pregnancy, third trimester: Secondary | ICD-10-CM

## 2016-12-27 DIAGNOSIS — Z3480 Encounter for supervision of other normal pregnancy, unspecified trimester: Secondary | ICD-10-CM

## 2016-12-27 DIAGNOSIS — O99013 Anemia complicating pregnancy, third trimester: Secondary | ICD-10-CM

## 2016-12-27 DIAGNOSIS — M549 Dorsalgia, unspecified: Secondary | ICD-10-CM

## 2016-12-27 DIAGNOSIS — R109 Unspecified abdominal pain: Secondary | ICD-10-CM

## 2016-12-27 MED ORDER — COMFORT FIT MATERNITY SUPP LG MISC: 1.0000 [IU] | Freq: Every day | 0 refills | Status: DC

## 2016-12-27 NOTE — Progress Notes (Signed)
   PRENATAL VISIT NOTE  Subjective:  Karen Mcbride is a 26 y.o. G2P1001 at 103w0d being seen today for ongoing prenatal care.  She is currently monitored for the following issues for this low-risk pregnancy and has Supervision of normal pregnancy, antepartum; Genital herpes; Low vitamin D level; and Anemia complicating pregnancy on her problem list.  Patient reports backache, no bleeding, no contractions, no cramping, no leaking and change in mulitple nevi noted.  Contractions: Irregular. Vag. Bleeding: None.  Movement: Present. Denies leaking of fluid.   The following portions of the patient's history were reviewed and updated as appropriate: allergies, current medications, past family history, past medical history, past social history, past surgical history and problem list. Problem list updated.  Objective:   Vitals:   12/27/16 1306  BP: 116/64  Pulse: 92  Weight: 157 lb 6.4 oz (71.4 kg)    Fetal Status: Fetal Heart Rate (bpm): 130 Fundal Height: 32 cm Movement: Present     General:  Alert, oriented and cooperative. Patient is in no acute distress.  Skin: Skin is warm and dry. No rash noted.   Cardiovascular: Normal heart rate noted  Respiratory: Normal respiratory effort, no problems with respiration noted  Abdomen: Soft, gravid, appropriate for gestational age. Pain/Pressure: Present     Pelvic:  Cervical exam deferred        Extremities: Normal range of motion.  Edema: Trace  Mental Status: Normal mood and affect. Normal behavior. Normal judgment and thought content.   Assessment and Plan:  Pregnancy: G2P1001 at [redacted]w[redacted]d  1. Supervision of other normal pregnancy, antepartum        2. Low vitamin D level     Taking weekly vitamin D  3. Anemia affecting pregnancy in third trimester     Taking OTC iron  4. Back pain affecting pregnancy in third trimester     - Elastic Bandages & Supports (COMFORT FIT MATERNITY SUPP LG) MISC; 1 Units by Does not apply route daily.   Dispense: 1 each; Refill: 0  5. Pelvic pain affecting pregnancy in third trimester, antepartum      - Elastic Bandages & Supports (COMFORT FIT MATERNITY SUPP LG) MISC; 1 Units by Does not apply route daily.  Dispense: 1 each; Refill: 0  6. Change in multiple nevi      Since start of pregnancy, 1 nevi noted to be changing on upper right abdomen.  - Ambulatory referral to Dermatology  Preterm labor symptoms and general obstetric precautions including but not limited to vaginal bleeding, contractions, leaking of fluid and fetal movement were reviewed in detail with the patient. Please refer to After Visit Summary for other counseling recommendations.  Return in about 2 weeks (around 01/10/2017) for ROB.   Morene Crocker, CNM

## 2017-01-11 ENCOUNTER — Encounter: Payer: Medicaid Other | Admitting: Certified Nurse Midwife

## 2017-01-12 ENCOUNTER — Ambulatory Visit (INDEPENDENT_AMBULATORY_CARE_PROVIDER_SITE_OTHER): Payer: Medicaid Other | Admitting: Obstetrics and Gynecology

## 2017-01-12 VITALS — BP 103/64 | HR 98 | Wt 158.0 lb

## 2017-01-12 DIAGNOSIS — Z3480 Encounter for supervision of other normal pregnancy, unspecified trimester: Secondary | ICD-10-CM

## 2017-01-12 DIAGNOSIS — A6000 Herpesviral infection of urogenital system, unspecified: Secondary | ICD-10-CM

## 2017-01-12 DIAGNOSIS — Z3483 Encounter for supervision of other normal pregnancy, third trimester: Secondary | ICD-10-CM

## 2017-01-12 NOTE — Progress Notes (Signed)
Prenatal Visit Note Date: 01/12/2017 Clinic: [facility]  Subjective:  Karen Mcbride is a 26 y.o. G2P1001 at [redacted]w[redacted]d being seen today for ongoing prenatal care.  She is currently monitored for the following issues for this low-risk pregnancy and has Supervision of normal pregnancy, antepartum; Genital herpes; Low vitamin D level; and Anemia complicating pregnancy on her problem list.  Patient reports no complaints.   Contractions: Irregular. Vag. Bleeding: None.  Movement: Present. Denies leaking of fluid.   The following portions of the patient's history were reviewed and updated as appropriate: allergies, current medications, past family history, past medical history, past social history, past surgical history and problem list. Problem list updated.  Objective:   Vitals:   01/12/17 1039  BP: 103/64  Pulse: 98  Weight: 158 lb (71.7 kg)    Fetal Status: Fetal Heart Rate (bpm): 145 Fundal Height: 34 cm Movement: Present  Presentation: Vertex  General:  Alert, oriented and cooperative. Patient is in no acute distress.  Skin: Skin is warm and dry. No rash noted.   Cardiovascular: Normal heart rate noted  Respiratory: Normal respiratory effort, no problems with respiration noted  Abdomen: Soft, gravid, appropriate for gestational age. Pain/Pressure: Present     Pelvic:  Cervical exam deferred        Extremities: Normal range of motion.  Edema: Trace  Mental Status: Normal mood and affect. Normal behavior. Normal judgment and thought content.   Urinalysis:      Assessment and Plan:  Pregnancy: G2P1001 at [redacted]w[redacted]d  1. Supervision of other normal pregnancy, antepartum Routine care. Would like BTL. R/b/a d/w her and she'd still like to have it done. Papers signed today. Can d/w pt more at subsequent visits. GBS nv  2. Genital herpes simplex, unspecified site D/w pt re: ppx starting at nv  Preterm labor symptoms and general obstetric precautions including but not  limited to vaginal bleeding, contractions, leaking of fluid and fetal movement were reviewed in detail with the patient. Please refer to After Visit Summary for other counseling recommendations.  Return in about 2 weeks (around 01/26/2017) for rob.   Aletha Halim, MD

## 2017-01-15 ENCOUNTER — Encounter: Payer: Self-pay | Admitting: *Deleted

## 2017-01-23 ENCOUNTER — Ambulatory Visit (INDEPENDENT_AMBULATORY_CARE_PROVIDER_SITE_OTHER): Payer: Medicaid Other | Admitting: Obstetrics and Gynecology

## 2017-01-23 ENCOUNTER — Other Ambulatory Visit (HOSPITAL_COMMUNITY)
Admission: RE | Admit: 2017-01-23 | Discharge: 2017-01-23 | Disposition: A | Discharge status: Home / Self Care | Payer: Medicaid Other | Source: Ambulatory Visit | Attending: Obstetrics and Gynecology | Admitting: Obstetrics and Gynecology

## 2017-01-23 VITALS — BP 116/72 | HR 107 | Wt 160.8 lb

## 2017-01-23 DIAGNOSIS — Z3A35 35 weeks gestation of pregnancy: Secondary | ICD-10-CM | POA: Diagnosis not present

## 2017-01-23 DIAGNOSIS — Z3483 Encounter for supervision of other normal pregnancy, third trimester: Secondary | ICD-10-CM

## 2017-01-23 DIAGNOSIS — Z3480 Encounter for supervision of other normal pregnancy, unspecified trimester: Secondary | ICD-10-CM

## 2017-01-23 DIAGNOSIS — A6000 Herpesviral infection of urogenital system, unspecified: Secondary | ICD-10-CM

## 2017-01-23 LAB — OB RESULTS CONSOLE GBS: STREP GROUP B AG: NEGATIVE

## 2017-01-23 MED ORDER — VALACYCLOVIR HCL 500 MG PO TABS: 500.0000 mg | ORAL_TABLET | Freq: Two times a day (BID) | ORAL | 6 refills | Status: DC

## 2017-01-23 NOTE — Progress Notes (Signed)
Subjective:  Karen Mcbride is a 26 y.o. G2P1001 at [redacted]w[redacted]d being seen today for ongoing prenatal care.  She is currently monitored for the following issues for this low-risk pregnancy and has Supervision of normal pregnancy, antepartum; Genital herpes; Low vitamin D level; and Anemia complicating pregnancy on her problem list.  Patient reports no complaints.  Contractions: Irregular. Vag. Bleeding: None.  Movement: Present. Denies leaking of fluid.   The following portions of the patient's history were reviewed and updated as appropriate: allergies, current medications, past family history, past medical history, past social history, past surgical history and problem list. Problem list updated.  Objective:   Vitals:   01/23/17 1619  BP: 116/72  Pulse: (!) 107  Weight: 160 lb 12.8 oz (72.9 kg)    Fetal Status: Fetal Heart Rate (bpm): 146   Movement: Present     General:  Alert, oriented and cooperative. Patient is in no acute distress.  Skin: Skin is warm and dry. No rash noted.   Cardiovascular: Normal heart rate noted  Respiratory: Normal respiratory effort, no problems with respiration noted  Abdomen: Soft, gravid, appropriate for gestational age. Pain/Pressure: Absent     Pelvic:  Cervical exam performed        Extremities: Normal range of motion.  Edema: Trace  Mental Status: Normal mood and affect. Normal behavior. Normal judgment and thought content.   Urinalysis:      Assessment and Plan:  Pregnancy: G2P1001 at [redacted]w[redacted]d  1. Genital herpes simplex, unspecified site Will start Valtrex prophylaxis  2. Supervision of other normal pregnancy, antepartum GBS and vaginal cultures today Labor precautions.  Preterm labor symptoms and general obstetric precautions including but not limited to vaginal bleeding, contractions, leaking of fluid and fetal movement were reviewed in detail with the patient. Please refer to After Visit Summary for other counseling recommendations.  Return  in about 1 week (around 01/30/2017) for OB visit.   Chancy Milroy, MD

## 2017-01-23 NOTE — Addendum Note (Signed)
Addended by: Lucianne Lei on: 01/23/2017 04:41 PM   Modules accepted: Orders

## 2017-01-24 LAB — CERVICOVAGINAL ANCILLARY ONLY
Bacterial vaginitis: NEGATIVE
Candida vaginitis: NEGATIVE
Chlamydia: NEGATIVE
Neisseria Gonorrhea: NEGATIVE
Trichomonas: NEGATIVE

## 2017-01-25 LAB — STREP GP B NAA: Strep Gp B NAA: NEGATIVE

## 2017-01-31 ENCOUNTER — Ambulatory Visit (INDEPENDENT_AMBULATORY_CARE_PROVIDER_SITE_OTHER): Payer: Medicaid Other | Admitting: Obstetrics & Gynecology

## 2017-01-31 DIAGNOSIS — Z3490 Encounter for supervision of normal pregnancy, unspecified, unspecified trimester: Secondary | ICD-10-CM

## 2017-01-31 DIAGNOSIS — Z3483 Encounter for supervision of other normal pregnancy, third trimester: Secondary | ICD-10-CM

## 2017-01-31 NOTE — Patient Instructions (Signed)
Vaginal Delivery Vaginal delivery means that you will give birth by pushing your baby out of your birth canal (vagina). A team of health care providers will help you before, during, and after vaginal delivery. Birth experiences are unique for every woman and every pregnancy, and birth experiences vary depending on where you choose to give birth. What should I do to prepare for my baby's birth? Before your baby is born, it is important to talk with your health care provider about:  Your labor and delivery preferences. These may include: ? Medicines that you may be given. ? How you will manage your pain. This might include non-medical pain relief techniques or injectable pain relief such as epidural analgesia. ? How you and your baby will be monitored during labor and delivery. ? Who may be in the labor and delivery room with you. ? Your feelings about surgical delivery of your baby (cesarean delivery, or C-section) if this becomes necessary. ? Your feelings about receiving donated blood through an IV tube (blood transfusion) if this becomes necessary.  Whether you are able: ? To take pictures or videos of the birth. ? To eat during labor and delivery. ? To move around, walk, or change positions during labor and delivery.  What to expect after your baby is born, such as: ? Whether delayed umbilical cord clamping and cutting is offered. ? Who will care for your baby right after birth. ? Medicines or tests that may be recommended for your baby. ? Whether breastfeeding is supported in your hospital or birth center. ? How long you will be in the hospital or birth center.  How any medical conditions you have may affect your baby or your labor and delivery experience.  To prepare for your baby's birth, you should also:  Attend all of your health care visits before delivery (prenatal visits) as recommended by your health care provider. This is important.  Prepare your home for your baby's  arrival. Make sure that you have: ? Diapers. ? Baby clothing. ? Feeding equipment. ? Safe sleeping arrangements for you and your baby.  Install a car seat in your vehicle. Have your car seat checked by a certified car seat installer to make sure that it is installed safely.  Think about who will help you with your new baby at home for at least the first several weeks after delivery.  What can I expect when I arrive at the birth center or hospital? Once you are in labor and have been admitted into the hospital or birth center, your health care provider may:  Review your pregnancy history and any concerns you have.  Insert an IV tube into one of your veins. This is used to give you fluids and medicines.  Check your blood pressure, pulse, temperature, and heart rate (vital signs).  Check whether your bag of water (amniotic sac) has broken (ruptured).  Talk with you about your birth plan and discuss pain control options.  Monitoring Your health care provider may monitor your contractions (uterine monitoring) and your baby's heart rate (fetal monitoring). You may need to be monitored:  Often, but not continuously (intermittently).  All the time or for long periods at a time (continuously). Continuous monitoring may be needed if: ? You are taking certain medicines, such as medicine to relieve pain or make your contractions stronger. ? You have pregnancy or labor complications.  Monitoring may be done by:  Placing a special stethoscope or a handheld monitoring device on your abdomen to   check your baby's heartbeat, and feeling your abdomen for contractions. This method of monitoring does not continuously record your baby's heartbeat or your contractions.  Placing monitors on your abdomen (external monitors) to record your baby's heartbeat and the frequency and length of contractions. You may not have to wear external monitors all the time.  Placing monitors inside of your uterus  (internal monitors) to record your baby's heartbeat and the frequency, length, and strength of your contractions. ? Your health care provider may use internal monitors if he or she needs more information about the strength of your contractions or your baby's heart rate. ? Internal monitors are put in place by passing a thin, flexible wire through your vagina and into your uterus. Depending on the type of monitor, it may remain in your uterus or on your baby's head until birth. ? Your health care provider will discuss the benefits and risks of internal monitoring with you and will ask for your permission before inserting the monitors.  Telemetry. This is a type of continuous monitoring that can be done with external or internal monitors. Instead of having to stay in bed, you are able to move around during telemetry. Ask your health care provider if telemetry is an option for you.  Physical exam Your health care provider may perform a physical exam. This may include:  Checking whether your baby is positioned: ? With the head toward your vagina (head-down). This is most common. ? With the head toward the top of your uterus (head-up or breech). If your baby is in a breech position, your health care provider may try to turn your baby to a head-down position so you can deliver vaginally. If it does not seem that your baby can be born vaginally, your provider may recommend surgery to deliver your baby. In rare cases, you may be able to deliver vaginally if your baby is head-up (breech delivery). ? Lying sideways (transverse). Babies that are lying sideways cannot be delivered vaginally.  Checking your cervix to determine: ? Whether it is thinning out (effacing). ? Whether it is opening up (dilating). ? How low your baby has moved into your birth canal.  What are the three stages of labor and delivery?  Normal labor and delivery is divided into the following three stages: Stage 1  Stage 1 is the  longest stage of labor, and it can last for hours or days. Stage 1 includes: ? Early labor. This is when contractions may be irregular, or regular and mild. Generally, early labor contractions are more than 10 minutes apart. ? Active labor. This is when contractions get longer, more regular, more frequent, and more intense. ? The transition phase. This is when contractions happen very close together, are very intense, and may last longer than during any other part of labor.  Contractions generally feel mild, infrequent, and irregular at first. They get stronger, more frequent (about every 2-3 minutes), and more regular as you progress from early labor through active labor and transition.  Many women progress through stage 1 naturally, but you may need help to continue making progress. If this happens, your health care provider may talk with you about: ? Rupturing your amniotic sac if it has not ruptured yet. ? Giving you medicine to help make your contractions stronger and more frequent.  Stage 1 ends when your cervix is completely dilated to 4 inches (10 cm) and completely effaced. This happens at the end of the transition phase. Stage 2  Once   your cervix is completely effaced and dilated to 4 inches (10 cm), you may start to feel an urge to push. It is common for the body to naturally take a rest before feeling the urge to push, especially if you received an epidural or certain other pain medicines. This rest period may last for up to 1-2 hours, depending on your unique labor experience.  During stage 2, contractions are generally less painful, because pushing helps relieve contraction pain. Instead of contraction pain, you may feel stretching and burning pain, especially when the widest part of your baby's head passes through the vaginal opening (crowning).  Your health care provider will closely monitor your pushing progress and your baby's progress through the vagina during stage 2.  Your  health care provider may massage the area of skin between your vaginal opening and anus (perineum) or apply warm compresses to your perineum. This helps it stretch as the baby's head starts to crown, which can help prevent perineal tearing. ? In some cases, an incision may be made in your perineum (episiotomy) to allow the baby to pass through the vaginal opening. An episiotomy helps to make the opening of the vagina larger to allow more room for the baby to fit through.  It is very important to breathe and focus so your health care provider can control the delivery of your baby's head. Your health care provider may have you decrease the intensity of your pushing, to help prevent perineal tearing.  After delivery of your baby's head, the shoulders and the rest of the body generally deliver very quickly and without difficulty.  Once your baby is delivered, the umbilical cord may be cut right away, or this may be delayed for 1-2 minutes, depending on your baby's health. This may vary among health care providers, hospitals, and birth centers.  If you and your baby are healthy enough, your baby may be placed on your chest or abdomen to help maintain the baby's temperature and to help you bond with each other. Some mothers and babies start breastfeeding at this time. Your health care team will dry your baby and help keep your baby warm during this time.  Your baby may need immediate care if he or she: ? Showed signs of distress during labor. ? Has a medical condition. ? Was born too early (prematurely). ? Had a bowel movement before birth (meconium). ? Shows signs of difficulty transitioning from being inside the uterus to being outside of the uterus. If you are planning to breastfeed, your health care team will help you begin a feeding. Stage 3  The third stage of labor starts immediately after the birth of your baby and ends after you deliver the placenta. The placenta is an organ that develops  during pregnancy to provide oxygen and nutrients to your baby in the womb.  Delivering the placenta may require some pushing, and you may have mild contractions. Breastfeeding can stimulate contractions to help you deliver the placenta.  After the placenta is delivered, your uterus should tighten (contract) and become firm. This helps to stop bleeding in your uterus. To help your uterus contract and to control bleeding, your health care provider may: ? Give you medicine by injection, through an IV tube, by mouth, or through your rectum (rectally). ? Massage your abdomen or perform a vaginal exam to remove any blood clots that are left in your uterus. ? Empty your bladder by placing a thin, flexible tube (catheter) into your bladder. ? Encourage   you to breastfeed your baby. After labor is over, you and your baby will be monitored closely to ensure that you are both healthy until you are ready to go home. Your health care team will teach you how to care for yourself and your baby. This information is not intended to replace advice given to you by your health care provider. Make sure you discuss any questions you have with your health care provider. Document Released: 03/28/2008 Document Revised: 01/07/2016 Document Reviewed: 07/04/2015 Elsevier Interactive Patient Education  2018 [employer]  

## 2017-01-31 NOTE — Progress Notes (Signed)
Patient feet has been swollen since 7.28.18 Complains of burning sensation on right side of stomach.

## 2017-01-31 NOTE — Progress Notes (Signed)
   PRENATAL VISIT NOTE  Subjective:  Karen Mcbride is a 26 y.o. G2P1001 at [redacted]w[redacted]d being seen today for ongoing prenatal care.  She is currently monitored for the following issues for this low-risk pregnancy and has Supervision of normal pregnancy, antepartum; Genital herpes; Low vitamin D level; and Anemia complicating pregnancy on her problem list.  Patient reports upper abdominal soreness.  Contractions: Irritability. Vag. Bleeding: None.  Movement: Present. Denies leaking of fluid.   The following portions of the patient's history were reviewed and updated as appropriate: allergies, current medications, past family history, past medical history, past social history, past surgical history and problem list. Problem list updated.  Objective:   Vitals:   01/31/17 0840  BP: 115/70  Pulse: (!) 117  Weight: 162 lb (73.5 kg)    Fetal Status: Fetal Heart Rate (bpm): 140 Fundal Height: 37 cm Movement: Present     General:  Alert, oriented and cooperative. Patient is in no acute distress.  Skin: Skin is warm and dry. No rash noted.   Cardiovascular: Normal heart rate noted  Respiratory: Normal respiratory effort, no problems with respiration noted  Abdomen: Soft, gravid, appropriate for gestational age.  Pain/Pressure: Absent     Pelvic: Cervical exam deferred        Extremities: Normal range of motion.  Edema: Mild pitting, slight indentation  Mental Status:  Normal mood and affect. Normal behavior. Normal judgment and thought content.   Assessment and Plan:  Pregnancy: G2P1001 at [redacted]w[redacted]d  1. Encounter for supervision of normal pregnancy, antepartum, unspecified gravidity   Term labor symptoms and general obstetric precautions including but not limited to vaginal bleeding, contractions, leaking of fluid and fetal movement were reviewed in detail with the patient. Please refer to After Visit Summary for other counseling recommendations.  Return in about 1 week (around  02/07/2017).   Emeterio Reeve, MD

## 2017-02-07 ENCOUNTER — Ambulatory Visit (INDEPENDENT_AMBULATORY_CARE_PROVIDER_SITE_OTHER): Payer: Medicaid Other | Admitting: Obstetrics & Gynecology

## 2017-02-07 ENCOUNTER — Encounter: Payer: Self-pay | Admitting: Obstetrics & Gynecology

## 2017-02-07 DIAGNOSIS — Z3490 Encounter for supervision of normal pregnancy, unspecified, unspecified trimester: Secondary | ICD-10-CM

## 2017-02-07 DIAGNOSIS — Z3493 Encounter for supervision of normal pregnancy, unspecified, third trimester: Secondary | ICD-10-CM

## 2017-02-07 NOTE — Patient Instructions (Signed)
Vaginal Delivery Vaginal delivery means that you will give birth by pushing your baby out of your birth canal (vagina). A team of health care providers will help you before, during, and after vaginal delivery. Birth experiences are unique for every woman and every pregnancy, and birth experiences vary depending on where you choose to give birth. What should I do to prepare for my baby's birth? Before your baby is born, it is important to talk with your health care provider about:  Your labor and delivery preferences. These may include: ? Medicines that you may be given. ? How you will manage your pain. This might include non-medical pain relief techniques or injectable pain relief such as epidural analgesia. ? How you and your baby will be monitored during labor and delivery. ? Who may be in the labor and delivery room with you. ? Your feelings about surgical delivery of your baby (cesarean delivery, or C-section) if this becomes necessary. ? Your feelings about receiving donated blood through an IV tube (blood transfusion) if this becomes necessary.  Whether you are able: ? To take pictures or videos of the birth. ? To eat during labor and delivery. ? To move around, walk, or change positions during labor and delivery.  What to expect after your baby is born, such as: ? Whether delayed umbilical cord clamping and cutting is offered. ? Who will care for your baby right after birth. ? Medicines or tests that may be recommended for your baby. ? Whether breastfeeding is supported in your hospital or birth center. ? How long you will be in the hospital or birth center.  How any medical conditions you have may affect your baby or your labor and delivery experience.  To prepare for your baby's birth, you should also:  Attend all of your health care visits before delivery (prenatal visits) as recommended by your health care provider. This is important.  Prepare your home for your baby's  arrival. Make sure that you have: ? Diapers. ? Baby clothing. ? Feeding equipment. ? Safe sleeping arrangements for you and your baby.  Install a car seat in your vehicle. Have your car seat checked by a certified car seat installer to make sure that it is installed safely.  Think about who will help you with your new baby at home for at least the first several weeks after delivery.  What can I expect when I arrive at the birth center or hospital? Once you are in labor and have been admitted into the hospital or birth center, your health care provider may:  Review your pregnancy history and any concerns you have.  Insert an IV tube into one of your veins. This is used to give you fluids and medicines.  Check your blood pressure, pulse, temperature, and heart rate (vital signs).  Check whether your bag of water (amniotic sac) has broken (ruptured).  Talk with you about your birth plan and discuss pain control options.  Monitoring Your health care provider may monitor your contractions (uterine monitoring) and your baby's heart rate (fetal monitoring). You may need to be monitored:  Often, but not continuously (intermittently).  All the time or for long periods at a time (continuously). Continuous monitoring may be needed if: ? You are taking certain medicines, such as medicine to relieve pain or make your contractions stronger. ? You have pregnancy or labor complications.  Monitoring may be done by:  Placing a special stethoscope or a handheld monitoring device on your abdomen to   check your baby's heartbeat, and feeling your abdomen for contractions. This method of monitoring does not continuously record your baby's heartbeat or your contractions.  Placing monitors on your abdomen (external monitors) to record your baby's heartbeat and the frequency and length of contractions. You may not have to wear external monitors all the time.  Placing monitors inside of your uterus  (internal monitors) to record your baby's heartbeat and the frequency, length, and strength of your contractions. ? Your health care provider may use internal monitors if he or she needs more information about the strength of your contractions or your baby's heart rate. ? Internal monitors are put in place by passing a thin, flexible wire through your vagina and into your uterus. Depending on the type of monitor, it may remain in your uterus or on your baby's head until birth. ? Your health care provider will discuss the benefits and risks of internal monitoring with you and will ask for your permission before inserting the monitors.  Telemetry. This is a type of continuous monitoring that can be done with external or internal monitors. Instead of having to stay in bed, you are able to move around during telemetry. Ask your health care provider if telemetry is an option for you.  Physical exam Your health care provider may perform a physical exam. This may include:  Checking whether your baby is positioned: ? With the head toward your vagina (head-down). This is most common. ? With the head toward the top of your uterus (head-up or breech). If your baby is in a breech position, your health care provider may try to turn your baby to a head-down position so you can deliver vaginally. If it does not seem that your baby can be born vaginally, your provider may recommend surgery to deliver your baby. In rare cases, you may be able to deliver vaginally if your baby is head-up (breech delivery). ? Lying sideways (transverse). Babies that are lying sideways cannot be delivered vaginally.  Checking your cervix to determine: ? Whether it is thinning out (effacing). ? Whether it is opening up (dilating). ? How low your baby has moved into your birth canal.  What are the three stages of labor and delivery?  Normal labor and delivery is divided into the following three stages: Stage 1  Stage 1 is the  longest stage of labor, and it can last for hours or days. Stage 1 includes: ? Early labor. This is when contractions may be irregular, or regular and mild. Generally, early labor contractions are more than 10 minutes apart. ? Active labor. This is when contractions get longer, more regular, more frequent, and more intense. ? The transition phase. This is when contractions happen very close together, are very intense, and may last longer than during any other part of labor.  Contractions generally feel mild, infrequent, and irregular at first. They get stronger, more frequent (about every 2-3 minutes), and more regular as you progress from early labor through active labor and transition.  Many women progress through stage 1 naturally, but you may need help to continue making progress. If this happens, your health care provider may talk with you about: ? Rupturing your amniotic sac if it has not ruptured yet. ? Giving you medicine to help make your contractions stronger and more frequent.  Stage 1 ends when your cervix is completely dilated to 4 inches (10 cm) and completely effaced. This happens at the end of the transition phase. Stage 2  Once   your cervix is completely effaced and dilated to 4 inches (10 cm), you may start to feel an urge to push. It is common for the body to naturally take a rest before feeling the urge to push, especially if you received an epidural or certain other pain medicines. This rest period may last for up to 1-2 hours, depending on your unique labor experience.  During stage 2, contractions are generally less painful, because pushing helps relieve contraction pain. Instead of contraction pain, you may feel stretching and burning pain, especially when the widest part of your baby's head passes through the vaginal opening (crowning).  Your health care provider will closely monitor your pushing progress and your baby's progress through the vagina during stage 2.  Your  health care provider may massage the area of skin between your vaginal opening and anus (perineum) or apply warm compresses to your perineum. This helps it stretch as the baby's head starts to crown, which can help prevent perineal tearing. ? In some cases, an incision may be made in your perineum (episiotomy) to allow the baby to pass through the vaginal opening. An episiotomy helps to make the opening of the vagina larger to allow more room for the baby to fit through.  It is very important to breathe and focus so your health care provider can control the delivery of your baby's head. Your health care provider may have you decrease the intensity of your pushing, to help prevent perineal tearing.  After delivery of your baby's head, the shoulders and the rest of the body generally deliver very quickly and without difficulty.  Once your baby is delivered, the umbilical cord may be cut right away, or this may be delayed for 1-2 minutes, depending on your baby's health. This may vary among health care providers, hospitals, and birth centers.  If you and your baby are healthy enough, your baby may be placed on your chest or abdomen to help maintain the baby's temperature and to help you bond with each other. Some mothers and babies start breastfeeding at this time. Your health care team will dry your baby and help keep your baby warm during this time.  Your baby may need immediate care if he or she: ? Showed signs of distress during labor. ? Has a medical condition. ? Was born too early (prematurely). ? Had a bowel movement before birth (meconium). ? Shows signs of difficulty transitioning from being inside the uterus to being outside of the uterus. If you are planning to breastfeed, your health care team will help you begin a feeding. Stage 3  The third stage of labor starts immediately after the birth of your baby and ends after you deliver the placenta. The placenta is an organ that develops  during pregnancy to provide oxygen and nutrients to your baby in the womb.  Delivering the placenta may require some pushing, and you may have mild contractions. Breastfeeding can stimulate contractions to help you deliver the placenta.  After the placenta is delivered, your uterus should tighten (contract) and become firm. This helps to stop bleeding in your uterus. To help your uterus contract and to control bleeding, your health care provider may: ? Give you medicine by injection, through an IV tube, by mouth, or through your rectum (rectally). ? Massage your abdomen or perform a vaginal exam to remove any blood clots that are left in your uterus. ? Empty your bladder by placing a thin, flexible tube (catheter) into your bladder. ? Encourage   you to breastfeed your baby. After labor is over, you and your baby will be monitored closely to ensure that you are both healthy until you are ready to go home. Your health care team will teach you how to care for yourself and your baby. This information is not intended to replace advice given to you by your health care provider. Make sure you discuss any questions you have with your health care provider. Document Released: 03/28/2008 Document Revised: 01/07/2016 Document Reviewed: 07/04/2015 Elsevier Interactive Patient Education  2018 [employer]  

## 2017-02-07 NOTE — Progress Notes (Signed)
   PRENATAL VISIT NOTE  Subjective:  Karen Mcbride is a 26 y.o. G2P1001 at [redacted]w[redacted]d being seen today for ongoing prenatal care.  She is currently monitored for the following issues for this low-risk pregnancy and has Supervision of normal pregnancy, antepartum; Genital herpes; Low vitamin D level; and Anemia complicating pregnancy on her problem list.  Patient reports no complaints.  Contractions: Irregular. Vag. Bleeding: None.  Movement: Present. Denies leaking of fluid.   The following portions of the patient's history were reviewed and updated as appropriate: allergies, current medications, past family history, past medical history, past social history, past surgical history and problem list. Problem list updated.  Objective:   Vitals:   02/07/17 1001  BP: 115/75  Pulse: 98  Weight: 162 lb (73.5 kg)    Fetal Status: Fetal Heart Rate (bpm): 140   Movement: Present     General:  Alert, oriented and cooperative. Patient is in no acute distress.  Skin: Skin is warm and dry. No rash noted.   Cardiovascular: Normal heart rate noted  Respiratory: Normal respiratory effort, no problems with respiration noted  Abdomen: Soft, gravid, appropriate for gestational age.  Pain/Pressure: Present     Pelvic: Cervical exam deferred        Extremities: Normal range of motion.  Edema: Mild pitting, slight indentation  Mental Status:  Normal mood and affect. Normal behavior. Normal judgment and thought content.   Assessment and Plan:  Pregnancy: G2P1001 at [redacted]w[redacted]d  1. Encounter for supervision of normal pregnancy, antepartum, unspecified gravidity Doing well  Term labor symptoms and general obstetric precautions including but not limited to vaginal bleeding, contractions, leaking of fluid and fetal movement were reviewed in detail with the patient. Please refer to After Visit Summary for other counseling recommendations.  Return in about 1 week (around 02/14/2017).   Emeterio Reeve, MD

## 2017-02-14 ENCOUNTER — Ambulatory Visit (INDEPENDENT_AMBULATORY_CARE_PROVIDER_SITE_OTHER): Payer: Medicaid Other | Admitting: Obstetrics & Gynecology

## 2017-02-14 DIAGNOSIS — Z3483 Encounter for supervision of other normal pregnancy, third trimester: Secondary | ICD-10-CM

## 2017-02-14 DIAGNOSIS — Z3490 Encounter for supervision of normal pregnancy, unspecified, unspecified trimester: Secondary | ICD-10-CM

## 2017-02-14 NOTE — Progress Notes (Signed)
   PRENATAL VISIT NOTE  Subjective:  Karen Mcbride is a 26 y.o. G2P1001 at [redacted]w[redacted]d being seen today for ongoing prenatal care.  She is currently monitored for the following issues for this low-risk pregnancy and has Supervision of normal pregnancy, antepartum; Genital herpes; Low vitamin D level; and Anemia complicating pregnancy on her problem list.  Patient reports no complaints.  Contractions: Irregular. Vag. Bleeding: None.  Movement: Present. Denies leaking of fluid.   The following portions of the patient's history were reviewed and updated as appropriate: allergies, current medications, past family history, past medical history, past social history, past surgical history and problem list. Problem list updated.  Objective:   Vitals:   02/14/17 1539  BP: 126/83  Pulse: (!) 106  Weight: 161 lb (73 kg)    Fetal Status: Fetal Heart Rate (bpm): 136   Movement: Present     General:  Alert, oriented and cooperative. Patient is in no acute distress.  Skin: Skin is warm and dry. No rash noted.   Cardiovascular: Normal heart rate noted  Respiratory: Normal respiratory effort, no problems with respiration noted  Abdomen: Soft, gravid, appropriate for gestational age.  Pain/Pressure: Present     Pelvic: Cervical exam performed        Extremities: Normal range of motion.  Edema: Mild pitting, slight indentation  Mental Status:  Normal mood and affect. Normal behavior. Normal judgment and thought content.   Assessment and Plan:  Pregnancy: G2P1001 at [redacted]w[redacted]d  1. Encounter for supervision of normal pregnancy, antepartum, unspecified gravidity doing well  Term labor symptoms and general obstetric precautions including but not limited to vaginal bleeding, contractions, leaking of fluid and fetal movement were reviewed in detail with the patient. Please refer to After Visit Summary for other counseling recommendations.  Return in about 1 week (around 02/21/2017).   Emeterio Reeve, MD

## 2017-02-14 NOTE — Patient Instructions (Signed)
Vaginal Delivery Vaginal delivery means that you will give birth by pushing your baby out of your birth canal (vagina). A team of health care providers will help you before, during, and after vaginal delivery. Birth experiences are unique for every woman and every pregnancy, and birth experiences vary depending on where you choose to give birth. What should I do to prepare for my baby's birth? Before your baby is born, it is important to talk with your health care provider about:  Your labor and delivery preferences. These may include: ? Medicines that you may be given. ? How you will manage your pain. This might include non-medical pain relief techniques or injectable pain relief such as epidural analgesia. ? How you and your baby will be monitored during labor and delivery. ? Who may be in the labor and delivery room with you. ? Your feelings about surgical delivery of your baby (cesarean delivery, or C-section) if this becomes necessary. ? Your feelings about receiving donated blood through an IV tube (blood transfusion) if this becomes necessary.  Whether you are able: ? To take pictures or videos of the birth. ? To eat during labor and delivery. ? To move around, walk, or change positions during labor and delivery.  What to expect after your baby is born, such as: ? Whether delayed umbilical cord clamping and cutting is offered. ? Who will care for your baby right after birth. ? Medicines or tests that may be recommended for your baby. ? Whether breastfeeding is supported in your hospital or birth center. ? How long you will be in the hospital or birth center.  How any medical conditions you have may affect your baby or your labor and delivery experience.  To prepare for your baby's birth, you should also:  Attend all of your health care visits before delivery (prenatal visits) as recommended by your health care provider. This is important.  Prepare your home for your baby's  arrival. Make sure that you have: ? Diapers. ? Baby clothing. ? Feeding equipment. ? Safe sleeping arrangements for you and your baby.  Install a car seat in your vehicle. Have your car seat checked by a certified car seat installer to make sure that it is installed safely.  Think about who will help you with your new baby at home for at least the first several weeks after delivery.  What can I expect when I arrive at the birth center or hospital? Once you are in labor and have been admitted into the hospital or birth center, your health care provider may:  Review your pregnancy history and any concerns you have.  Insert an IV tube into one of your veins. This is used to give you fluids and medicines.  Check your blood pressure, pulse, temperature, and heart rate (vital signs).  Check whether your bag of water (amniotic sac) has broken (ruptured).  Talk with you about your birth plan and discuss pain control options.  Monitoring Your health care provider may monitor your contractions (uterine monitoring) and your baby's heart rate (fetal monitoring). You may need to be monitored:  Often, but not continuously (intermittently).  All the time or for long periods at a time (continuously). Continuous monitoring may be needed if: ? You are taking certain medicines, such as medicine to relieve pain or make your contractions stronger. ? You have pregnancy or labor complications.  Monitoring may be done by:  Placing a special stethoscope or a handheld monitoring device on your abdomen to   check your baby's heartbeat, and feeling your abdomen for contractions. This method of monitoring does not continuously record your baby's heartbeat or your contractions.  Placing monitors on your abdomen (external monitors) to record your baby's heartbeat and the frequency and length of contractions. You may not have to wear external monitors all the time.  Placing monitors inside of your uterus  (internal monitors) to record your baby's heartbeat and the frequency, length, and strength of your contractions. ? Your health care provider may use internal monitors if he or she needs more information about the strength of your contractions or your baby's heart rate. ? Internal monitors are put in place by passing a thin, flexible wire through your vagina and into your uterus. Depending on the type of monitor, it may remain in your uterus or on your baby's head until birth. ? Your health care provider will discuss the benefits and risks of internal monitoring with you and will ask for your permission before inserting the monitors.  Telemetry. This is a type of continuous monitoring that can be done with external or internal monitors. Instead of having to stay in bed, you are able to move around during telemetry. Ask your health care provider if telemetry is an option for you.  Physical exam Your health care provider may perform a physical exam. This may include:  Checking whether your baby is positioned: ? With the head toward your vagina (head-down). This is most common. ? With the head toward the top of your uterus (head-up or breech). If your baby is in a breech position, your health care provider may try to turn your baby to a head-down position so you can deliver vaginally. If it does not seem that your baby can be born vaginally, your provider may recommend surgery to deliver your baby. In rare cases, you may be able to deliver vaginally if your baby is head-up (breech delivery). ? Lying sideways (transverse). Babies that are lying sideways cannot be delivered vaginally.  Checking your cervix to determine: ? Whether it is thinning out (effacing). ? Whether it is opening up (dilating). ? How low your baby has moved into your birth canal.  What are the three stages of labor and delivery?  Normal labor and delivery is divided into the following three stages: Stage 1  Stage 1 is the  longest stage of labor, and it can last for hours or days. Stage 1 includes: ? Early labor. This is when contractions may be irregular, or regular and mild. Generally, early labor contractions are more than 10 minutes apart. ? Active labor. This is when contractions get longer, more regular, more frequent, and more intense. ? The transition phase. This is when contractions happen very close together, are very intense, and may last longer than during any other part of labor.  Contractions generally feel mild, infrequent, and irregular at first. They get stronger, more frequent (about every 2-3 minutes), and more regular as you progress from early labor through active labor and transition.  Many women progress through stage 1 naturally, but you may need help to continue making progress. If this happens, your health care provider may talk with you about: ? Rupturing your amniotic sac if it has not ruptured yet. ? Giving you medicine to help make your contractions stronger and more frequent.  Stage 1 ends when your cervix is completely dilated to 4 inches (10 cm) and completely effaced. This happens at the end of the transition phase. Stage 2  Once   your cervix is completely effaced and dilated to 4 inches (10 cm), you may start to feel an urge to push. It is common for the body to naturally take a rest before feeling the urge to push, especially if you received an epidural or certain other pain medicines. This rest period may last for up to 1-2 hours, depending on your unique labor experience.  During stage 2, contractions are generally less painful, because pushing helps relieve contraction pain. Instead of contraction pain, you may feel stretching and burning pain, especially when the widest part of your baby's head passes through the vaginal opening (crowning).  Your health care provider will closely monitor your pushing progress and your baby's progress through the vagina during stage 2.  Your  health care provider may massage the area of skin between your vaginal opening and anus (perineum) or apply warm compresses to your perineum. This helps it stretch as the baby's head starts to crown, which can help prevent perineal tearing. ? In some cases, an incision may be made in your perineum (episiotomy) to allow the baby to pass through the vaginal opening. An episiotomy helps to make the opening of the vagina larger to allow more room for the baby to fit through.  It is very important to breathe and focus so your health care provider can control the delivery of your baby's head. Your health care provider may have you decrease the intensity of your pushing, to help prevent perineal tearing.  After delivery of your baby's head, the shoulders and the rest of the body generally deliver very quickly and without difficulty.  Once your baby is delivered, the umbilical cord may be cut right away, or this may be delayed for 1-2 minutes, depending on your baby's health. This may vary among health care providers, hospitals, and birth centers.  If you and your baby are healthy enough, your baby may be placed on your chest or abdomen to help maintain the baby's temperature and to help you bond with each other. Some mothers and babies start breastfeeding at this time. Your health care team will dry your baby and help keep your baby warm during this time.  Your baby may need immediate care if he or she: ? Showed signs of distress during labor. ? Has a medical condition. ? Was born too early (prematurely). ? Had a bowel movement before birth (meconium). ? Shows signs of difficulty transitioning from being inside the uterus to being outside of the uterus. If you are planning to breastfeed, your health care team will help you begin a feeding. Stage 3  The third stage of labor starts immediately after the birth of your baby and ends after you deliver the placenta. The placenta is an organ that develops  during pregnancy to provide oxygen and nutrients to your baby in the womb.  Delivering the placenta may require some pushing, and you may have mild contractions. Breastfeeding can stimulate contractions to help you deliver the placenta.  After the placenta is delivered, your uterus should tighten (contract) and become firm. This helps to stop bleeding in your uterus. To help your uterus contract and to control bleeding, your health care provider may: ? Give you medicine by injection, through an IV tube, by mouth, or through your rectum (rectally). ? Massage your abdomen or perform a vaginal exam to remove any blood clots that are left in your uterus. ? Empty your bladder by placing a thin, flexible tube (catheter) into your bladder. ? Encourage   you to breastfeed your baby. After labor is over, you and your baby will be monitored closely to ensure that you are both healthy until you are ready to go home. Your health care team will teach you how to care for yourself and your baby. This information is not intended to replace advice given to you by your health care provider. Make sure you discuss any questions you have with your health care provider. Document Released: 03/28/2008 Document Revised: 01/07/2016 Document Reviewed: 07/04/2015 Elsevier Interactive Patient Education  2018 [employer]  

## 2017-02-14 NOTE — Progress Notes (Signed)
Pt requests to have cx checked today.

## 2017-02-15 ENCOUNTER — Encounter (HOSPITAL_COMMUNITY): Payer: Self-pay | Admitting: *Deleted

## 2017-02-15 ENCOUNTER — Telehealth (HOSPITAL_COMMUNITY): Payer: Self-pay | Admitting: *Deleted

## 2017-02-15 NOTE — Telephone Encounter (Signed)
Preadmission screen  

## 2017-02-21 ENCOUNTER — Encounter: Payer: Medicaid Other | Admitting: Obstetrics and Gynecology

## 2017-02-26 ENCOUNTER — Encounter (HOSPITAL_COMMUNITY): Payer: Self-pay

## 2017-02-26 ENCOUNTER — Other Ambulatory Visit: Payer: Medicaid Other

## 2017-02-26 ENCOUNTER — Ambulatory Visit (INDEPENDENT_AMBULATORY_CARE_PROVIDER_SITE_OTHER): Payer: Medicaid Other | Admitting: Advanced Practice Midwife

## 2017-02-26 ENCOUNTER — Inpatient Hospital Stay (HOSPITAL_COMMUNITY)
Admission: AD | Admit: 2017-02-26 | Discharge: 2017-03-01 | DRG: 774 | Disposition: A | Discharge status: Home / Self Care | Payer: Medicaid Other | Source: Ambulatory Visit | Attending: Family Medicine | Admitting: Family Medicine

## 2017-02-26 VITALS — BP 124/82 | HR 102 | Wt 166.0 lb

## 2017-02-26 DIAGNOSIS — O9089 Other complications of the puerperium, not elsewhere classified: Secondary | ICD-10-CM | POA: Diagnosis not present

## 2017-02-26 DIAGNOSIS — Z87891 Personal history of nicotine dependence: Secondary | ICD-10-CM | POA: Diagnosis not present

## 2017-02-26 DIAGNOSIS — Z3483 Encounter for supervision of other normal pregnancy, third trimester: Secondary | ICD-10-CM | POA: Diagnosis not present

## 2017-02-26 DIAGNOSIS — R030 Elevated blood-pressure reading, without diagnosis of hypertension: Secondary | ICD-10-CM | POA: Diagnosis not present

## 2017-02-26 DIAGNOSIS — O134 Gestational [pregnancy-induced] hypertension without significant proteinuria, complicating childbirth: Secondary | ICD-10-CM | POA: Diagnosis present

## 2017-02-26 DIAGNOSIS — A6000 Herpesviral infection of urogenital system, unspecified: Secondary | ICD-10-CM | POA: Diagnosis present

## 2017-02-26 DIAGNOSIS — Z3490 Encounter for supervision of normal pregnancy, unspecified, unspecified trimester: Secondary | ICD-10-CM

## 2017-02-26 DIAGNOSIS — Z3A40 40 weeks gestation of pregnancy: Secondary | ICD-10-CM | POA: Diagnosis not present

## 2017-02-26 DIAGNOSIS — O368390 Maternal care for abnormalities of the fetal heart rate or rhythm, unspecified trimester, not applicable or unspecified: Secondary | ICD-10-CM

## 2017-02-26 DIAGNOSIS — O9902 Anemia complicating childbirth: Secondary | ICD-10-CM | POA: Diagnosis present

## 2017-02-26 DIAGNOSIS — O480 Post-term pregnancy: Secondary | ICD-10-CM | POA: Diagnosis present

## 2017-02-26 DIAGNOSIS — D649 Anemia, unspecified: Secondary | ICD-10-CM | POA: Diagnosis present

## 2017-02-26 DIAGNOSIS — O9832 Other infections with a predominantly sexual mode of transmission complicating childbirth: Secondary | ICD-10-CM | POA: Diagnosis present

## 2017-02-26 DIAGNOSIS — Z3480 Encounter for supervision of other normal pregnancy, unspecified trimester: Secondary | ICD-10-CM

## 2017-02-26 LAB — CBC
HCT: 30.2 % — ABNORMAL LOW (ref 36.0–46.0)
HEMOGLOBIN: 10.2 g/dL — AB (ref 12.0–15.0)
MCH: 29.7 pg (ref 26.0–34.0)
MCHC: 33.8 g/dL (ref 30.0–36.0)
MCV: 87.8 fL (ref 78.0–100.0)
PLATELETS: 123 10*3/uL — AB (ref 150–400)
RBC: 3.44 MIL/uL — ABNORMAL LOW (ref 3.87–5.11)
RDW: 14.9 % (ref 11.5–15.5)
WBC: 4.9 10*3/uL (ref 4.0–10.5)

## 2017-02-26 LAB — TYPE AND SCREEN
ABO/RH(D): B POS
Antibody Screen: NEGATIVE

## 2017-02-26 MED ORDER — MISOPROSTOL 25 MCG QUARTER TABLET
25.0000 ug | ORAL_TABLET | ORAL | Status: DC | PRN
  Administered 2017-02-26: 25 ug via VAGINAL
  Filled 2017-02-26 (×2): qty 1

## 2017-02-26 MED ORDER — TERBUTALINE SULFATE 1 MG/ML IJ SOLN
0.2500 mg | Freq: Once | INTRAMUSCULAR | Status: DC | PRN
  Filled 2017-02-26: qty 1

## 2017-02-26 MED ORDER — OXYTOCIN 40 UNITS IN LACTATED RINGERS INFUSION - SIMPLE MED: 2.5000 [IU]/h | INTRAVENOUS | Status: DC

## 2017-02-26 MED ORDER — ACETAMINOPHEN 325 MG PO TABS
650.0000 mg | ORAL_TABLET | ORAL | Status: DC | PRN
  Filled 2017-02-26: qty 2

## 2017-02-26 MED ORDER — SOD CITRATE-CITRIC ACID 500-334 MG/5ML PO SOLN: 30.0000 mL | ORAL | Status: DC | PRN

## 2017-02-26 MED ORDER — ONDANSETRON HCL 4 MG/2ML IJ SOLN
4.0000 mg | Freq: Four times a day (QID) | INTRAMUSCULAR | Status: DC | PRN
  Administered 2017-02-27: 4 mg via INTRAVENOUS
  Filled 2017-02-26: qty 2

## 2017-02-26 MED ORDER — LACTATED RINGERS IV SOLN: 500.0000 mL | INTRAVENOUS | Status: DC | PRN

## 2017-02-26 MED ORDER — LACTATED RINGERS IV SOLN
INTRAVENOUS | Status: DC
  Administered 2017-02-26 – 2017-02-27 (×2): via INTRAVENOUS

## 2017-02-26 MED ORDER — OXYCODONE-ACETAMINOPHEN 5-325 MG PO TABS: 2.0000 | ORAL_TABLET | ORAL | Status: DC | PRN

## 2017-02-26 MED ORDER — LIDOCAINE HCL (PF) 1 % IJ SOLN
30.0000 mL | INTRAMUSCULAR | Status: DC | PRN
  Filled 2017-02-26: qty 30

## 2017-02-26 MED ORDER — OXYTOCIN BOLUS FROM INFUSION
500.0000 mL | Freq: Once | INTRAVENOUS | Status: AC
  Administered 2017-02-27: 500 mL/h via INTRAVENOUS

## 2017-02-26 MED ORDER — OXYCODONE-ACETAMINOPHEN 5-325 MG PO TABS
1.0000 | ORAL_TABLET | ORAL | Status: DC | PRN
  Administered 2017-02-26 – 2017-02-27 (×2): 1 via ORAL
  Filled 2017-02-26 (×2): qty 1

## 2017-02-26 MED ORDER — MISOPROSTOL 200 MCG PO TABS
50.0000 ug | ORAL_TABLET | ORAL | Status: DC
  Administered 2017-02-26: 50 ug via ORAL
  Administered 2017-02-27: 20:00:00 via ORAL
  Administered 2017-02-28 (×2): 50 ug via ORAL
  Filled 2017-02-26 (×2): qty 1

## 2017-02-26 NOTE — MAU Note (Signed)
Pt sent from MD office, to be induced on Wednesday.  Had non-reactive NST, sent to MAU for further monitoring.  Having mild contractions, denies bleeding or LOF.  Membranes were stripped today.

## 2017-02-26 NOTE — Patient Instructions (Signed)
Labor Induction Labor induction is when steps are taken to cause a pregnant woman to begin the labor process. Most women go into labor on their own between 37 weeks and 42 weeks of the pregnancy. When this does not happen or when there is a medical need, methods may be used to induce labor. Labor induction causes a pregnant woman's uterus to contract. It also causes the cervix to soften (ripen), open (dilate), and thin out (efface). Usually, labor is not induced before 39 weeks of the pregnancy unless there is a problem with the baby or mother. Before inducing labor, your health care provider will consider a number of factors, including the following:  The medical condition of you and the baby.  How many weeks along you are.  The status of the baby's lung maturity.  The condition of the cervix.  The position of the baby. What are the reasons for labor induction? Labor may be induced for the following reasons:  The health of the baby or mother is at risk.  The pregnancy is overdue by 1 week or more.  The water breaks but labor does not start on its own.  The mother has a health condition or serious illness, such as high blood pressure, infection, placental abruption, or diabetes.  The amniotic fluid amounts are low around the baby.  The baby is distressed. Convenience or wanting the baby to be born on a certain date is not a reason for inducing labor. What methods are used for labor induction? Several methods of labor induction may be used, such as:  Prostaglandin medicine. This medicine causes the cervix to dilate and ripen. The medicine will also start contractions. It can be taken by mouth or by inserting a suppository into the vagina.  Inserting a thin tube (catheter) with a balloon on the end into the vagina to dilate the cervix. Once inserted, the balloon is expanded with water, which causes the cervix to open.  Stripping the membranes. Your health care provider separates  amniotic sac tissue from the cervix, causing the cervix to be stretched and causing the release of a hormone called progesterone. This may cause the uterus to contract. It is often done during an office visit. You will be sent home to wait for the contractions to begin. You will then come in for an induction.  Breaking the water. Your health care provider makes a hole in the amniotic sac using a small instrument. Once the amniotic sac breaks, contractions should begin. This may still take hours to see an effect.  Medicine to trigger or strengthen contractions. This medicine is given through an IV access tube inserted into a vein in your arm. All of the methods of induction, besides stripping the membranes, will be done in the hospital. Induction is done in the hospital so that you and the baby can be carefully monitored. How long does it take for labor to be induced? Some inductions can take up to 2-3 days. Depending on the cervix, it usually takes less time. It takes longer when you are induced early in the pregnancy or if this is your first pregnancy. If a mother is still pregnant and the induction has been going on for 2-3 days, either the mother will be sent home or a cesarean delivery will be needed. What are the risks associated with labor induction? Some of the risks of induction include:  Changes in fetal heart rate, such as too high, too low, or erratic.  Fetal distress.    Chance of infection for the mother and baby.  Increased chance of having a cesarean delivery.  Breaking off (abruption) of the placenta from the uterus (rare).  Uterine rupture (very rare). When induction is needed for medical reasons, the benefits of induction may outweigh the risks. What are some reasons for not inducing labor? Labor induction should not be done if:  It is shown that your baby does not tolerate labor.  You have had previous surgeries on your uterus, such as a myomectomy or the removal of  fibroids.  Your placenta lies very low in the uterus and blocks the opening of the cervix (placenta previa).  Your baby is not in a head-down position.  The umbilical cord drops down into the birth canal in front of the baby. This could cut off the baby's blood and oxygen supply.  You have had a previous cesarean delivery.  There are unusual circumstances, such as the baby being extremely premature. This information is not intended to replace advice given to you by your health care provider. Make sure you discuss any questions you have with your health care provider. Document Released: 11/08/2006 Document Revised: 11/25/2015 Document Reviewed: 01/16/2013 Elsevier Interactive Patient Education  2017 [employer]  

## 2017-02-26 NOTE — Progress Notes (Signed)
Labor Progress Note  Karen Mcbride is a 26 y.o. female G2P1001 at [redacted]w[redacted]d admitted for IOL for non-reassuring fetal surveillance post-term.  S: Patient seen & examined for progress of labor. Patient laying comfortably in bed.   O: BP 122/68   Pulse 84   Temp (!) 97.4 F (36.3 C) (Axillary)   Resp 16   Ht 5\' 3"  (1.6 m)   Wt 75.3 kg (166 lb)   LMP 05/17/2016   BMI 29.41 kg/m   FHT: 140bpm, mod var, +accels, no decels TOCO: occasional contractions, patient looks comfortable during contractions  CVE: Dilation: 1 Effacement (%): Thick Station: -3 Presentation: Vertex Exam by:: Dr. Gerarda Fraction  A&P: 26 y.o. G2P1001 [redacted]w[redacted]d here for OL for non-reassuring fetal surveillance post-term.  Labor:Cytotec x2, last dose at 21:10. Attempted to place FB around 8:30 but was unable to place due to the significantly posterior position of cervix, will try again later Fetal Wellbeing:Cat I Pain Control: Pt was very uncomfortable during cervical exam and FB attempt and was started on percocet shortly after wards. Pt also expressed desire to have epidural during her labor. Pain medications are available upon request. Anticipated VIF:[id number]  Continue expectant management  Janan Ridge Medical Student 02/26/2017 9:58 PM

## 2017-02-26 NOTE — Progress Notes (Signed)
   PRENATAL VISIT NOTE  Subjective:  Karen Mcbride is a 26 y.o. G2P1001 at [redacted]w[redacted]d being seen today for ongoing prenatal care.  She is currently monitored for the following issues for this low-risk pregnancy and has Supervision of normal pregnancy, antepartum; Genital herpes; Low vitamin D level; and Anemia complicating pregnancy on her problem list.  Patient reports occasional contractions.  Contractions: Irritability. Vag. Bleeding: None.  Movement: Present. Denies leaking of fluid.   The following portions of the patient's history were reviewed and updated as appropriate: allergies, current medications, past family history, past medical history, past social history, past surgical history and problem list. Problem list updated.  Objective:   Vitals:   02/26/17 0956  BP: 124/82  Pulse: (!) 102  Weight: 166 lb (75.3 kg)    Fetal Status: Fetal Heart Rate (bpm): 154 Fundal Height: 40 cm Movement: Present    NST non-reactive. Unsure is Baseline 130's w/ prolonged accels or 150's w/ decels. Mod variability. Very active baby.  General:  Alert, oriented and cooperative. Patient is in no acute distress.  Skin: Skin is warm and dry. No rash noted.   Cardiovascular: Normal heart rate noted  Respiratory: Normal respiratory effort, no problems with respiration noted  Abdomen: Soft, gravid, appropriate for gestational age.  Pain/Pressure: Present     Pelvic: Cervical exam performed Dilation: 1 Effacement (%): 50 Station: Ballotable  Extremities: Normal range of motion.  Edema: Mild pitting, slight indentation  Mental Status:  Normal mood and affect. Normal behavior. Normal judgment and thought content.   Assessment and Plan:  Pregnancy: G2P1001 at [redacted]w[redacted]d  1. Supervision of other normal pregnancy, antepartum   2. Genital herpes simplex, unspecified site - On Valtrex. No outbreak or prodrome.   3. Post-term pregnancy, 40-42 weeks of gestation  - Fetal nonstress test - Sent to MAU  immediately for prolonged monitoring and AFI. Report given to Dr. Roselie Mcbride and Karen Mcbride, CNM. Explained that may need IOL.  Term labor symptoms and general obstetric precautions including but not limited to vaginal bleeding, contractions, leaking of fluid and fetal movement were reviewed in detail with the patient. Please refer to After Visit Summary for other counseling recommendations.  Return in about 4 weeks (around 03/26/2017).   Karen Mcbride, CNM

## 2017-02-26 NOTE — Anesthesia Pain Management Evaluation Note (Signed)
  CRNA Pain Management Visit Note  Patient: Karen Mcbride, 26 y.o., female  "Hello I am a member of the anesthesia team at [facility]. We have an anesthesia team available at all times to provide care throughout the hospital, including epidural management and anesthesia for C-section. I don't know your plan for the delivery whether it a natural birth, water birth, IV sedation, nitrous supplementation, doula or epidural, but we want to meet your pain goals."   1.Was your pain managed to your expectations on prior hospitalizations?   Yes   2.What is your expectation for pain management during this hospitalization?     Epidural  3.How can we help you reach that goal? Epidural when I meet my pain goal.  Record the patient's initial score and the patient's pain goal.   Pain: 4  Pain Goal: 6 [facility] wants you to be able to say your pain was always managed very well.  Andray Assefa 02/26/2017

## 2017-02-26 NOTE — Progress Notes (Signed)
Post dates. Patient wants membranes strip. Patient sent to MAU

## 2017-02-26 NOTE — H&P (Signed)
Karen Mcbride is a 26 y.o. female G2P1001 at [redacted]w[redacted]d admitted for induction of labor for non-reassuring fetal surveillance post-term.  She has a history of HSV and has been on prophylactic valtrex since [redacted] weeks gestation.  She reports good fetal movement, denies LOF, regular contractions, vaginal bleeding, vaginal itching/burning, urinary symptoms, h/a, dizziness, n/v, or fever/chills.     Clinic [facility] Prenatal Labs  Dating LMP Blood type: B/Positive/-- (03/22 1619)   Genetic Screen  AFP:  negative NIPS:mat21:normal Antibody:Negative (03/22 1619)  Anatomic [place] Normal female fetus Rubella: 7.00 (03/22 1619)  GTT A1C:normal               Third trimester: WNL RPR: Non Reactive (06/01 1108)   Flu vaccine Pt states that she received in 04-2016 HBsAg: Negative (03/22 1619)   TDaP vaccine 2018@work  w/ [facility] per pt                                 Rhogam:n/a B+ HIV: NR     Baby Food  Breast/Bottle                                             ZOX:[id number] (07/24 1648)  Contraception nexplanon ?/ tubal JWJ:XBJYNWGN 3/18  Circumcision n/a CF: neg  Pediatrician [facility]   Support Person Mother/FOB     OB History    Gravida Para Term Preterm AB Living   2 1 1     1    SAB TAB Ectopic Multiple Live Births           1     Past Medical History:  Diagnosis Date  . Depression   . Genital herpes    Past Surgical History:  Procedure Laterality Date  . TONSILLECTOMY     Family History: family history includes Diabetes in her maternal aunt and maternal grandfather; Diverticulosis in her mother. Social History:  reports that she quit smoking about 2 months ago. Her smoking use included Cigarettes. She has a 1.00 pack-year smoking history. She has never used smokeless tobacco. She reports that she drinks alcohol. She reports that she does not use drugs.     Maternal Diabetes: No Genetic Screening: Normal Maternal Ultrasounds/Referrals: Normal Fetal Ultrasounds or other Referrals:   None Maternal Substance Abuse:  No Significant Maternal Medications:  Meds include: Other:  Significant Maternal Lab Results:  Lab values include: Group B Strep negative Other Comments:  On Valtrex for suppression of HSV  Review of Systems  Constitutional: Negative for chills, fever and malaise/fatigue.  Eyes: Negative for blurred vision.  Respiratory: Negative for cough and shortness of breath.   Cardiovascular: Negative for chest pain.  Gastrointestinal: Negative for heartburn and vomiting.  Genitourinary: Negative for dysuria, frequency and urgency.  Musculoskeletal: Negative.   Neurological: Negative for dizziness and headaches.  Psychiatric/Behavioral: Negative for depression.   Maternal Medical History:  Reason for admission: Non-reassuring fetal surveillance  Contractions: Frequency: rare.   Perceived severity is mild.    Fetal activity: Perceived fetal activity is normal.   Last perceived fetal movement was within the past hour.    Prenatal complications: no prenatal complications Prenatal Complications - Diabetes: none.      Blood pressure 108/61, pulse 92, temperature (!) 97.4 F (36.3 C), temperature source Axillary, resp. rate 16, height 5\' 3"  (1.6  m), weight 166 lb (75.3 kg), last menstrual period 05/17/2016, unknown if currently breastfeeding. Maternal Exam:  Uterine Assessment: Contraction strength is mild.  Contraction frequency is rare.   Abdomen: Estimated fetal weight is GTT wnl.   Fetal presentation: vertex  Cervix: Cervix evaluated by digital exam.     Fetal Exam Fetal Monitor Review: Mode: ultrasound.   Baseline rate: 140.  Variability: moderate (6-25 bpm).   Pattern: no accelerations and no decelerations.    Fetal State Assessment: Category I - tracings are normal.    Vertex by bedside US by CNM  On visual inspection, no active lesions of HSV, pt denies any symptoms.  Last outbreak 2 years ago.  Physical Exam  Nursing note and vitals  reviewed. Constitutional: She is oriented to person, place, and time. She appears well-developed and well-nourished.  Neck: Normal range of motion.  Cardiovascular: Normal rate.   Respiratory: Effort normal.  GI: Soft.  Musculoskeletal: Normal range of motion.  Neurological: She is alert and oriented to person, place, and time.  Skin: Skin is warm and dry.  Psychiatric: She has a normal mood and affect. Her behavior is normal. Judgment and thought content normal.    Prenatal labs: ABO, Rh: --/--/B POS (08/27 1405) Antibody: NEG (08/27 1405) Rubella: 7.00 (03/22 1619) RPR: Non Reactive (06/01 1108)  HBsAg: Negative (03/22 1619)  HIV:    GBS: Negative (07/24 1648)   Assessment/Plan: 1. Non-reassuring electronic fetal monitoring tracing   2. Post term pregnancy over 40 weeks   3.  GBS negative  Admit to BS IOL with vaginal Cytotec Anticipate NSVD  Fatima Blank, CNM 4:16 PM    Fatima Blank 02/26/2017, 4:15 PM

## 2017-02-27 ENCOUNTER — Inpatient Hospital Stay (HOSPITAL_COMMUNITY): Payer: Medicaid Other | Admitting: Anesthesiology

## 2017-02-27 ENCOUNTER — Encounter (HOSPITAL_COMMUNITY): Payer: Self-pay | Admitting: *Deleted

## 2017-02-27 DIAGNOSIS — O480 Post-term pregnancy: Secondary | ICD-10-CM

## 2017-02-27 DIAGNOSIS — Z3A40 40 weeks gestation of pregnancy: Secondary | ICD-10-CM

## 2017-02-27 LAB — RPR: RPR: NONREACTIVE

## 2017-02-27 LAB — CBC
HEMATOCRIT: 31.6 % — AB (ref 36.0–46.0)
Hemoglobin: 10.8 g/dL — ABNORMAL LOW (ref 12.0–15.0)
MCH: 29.5 pg (ref 26.0–34.0)
MCHC: 34.2 g/dL (ref 30.0–36.0)
MCV: 86.3 fL (ref 78.0–100.0)
Platelets: 109 10*3/uL — ABNORMAL LOW (ref 150–400)
RBC: 3.66 MIL/uL — ABNORMAL LOW (ref 3.87–5.11)
RDW: 14.9 % (ref 11.5–15.5)
WBC: 11.2 10*3/uL — AB (ref 4.0–10.5)

## 2017-02-27 MED ORDER — ACETAMINOPHEN 325 MG PO TABS
650.0000 mg | ORAL_TABLET | ORAL | Status: DC | PRN
  Administered 2017-02-28: 650 mg via ORAL

## 2017-02-27 MED ORDER — PHENYLEPHRINE 40 MCG/ML (10ML) SYRINGE FOR IV PUSH (FOR BLOOD PRESSURE SUPPORT)
80.0000 ug | PREFILLED_SYRINGE | INTRAVENOUS | Status: DC | PRN
  Filled 2017-02-27: qty 5

## 2017-02-27 MED ORDER — IBUPROFEN 600 MG PO TABS
600.0000 mg | ORAL_TABLET | Freq: Four times a day (QID) | ORAL | Status: DC
  Administered 2017-02-28 – 2017-03-01 (×6): 600 mg via ORAL
  Filled 2017-02-27 (×6): qty 1

## 2017-02-27 MED ORDER — WITCH HAZEL-GLYCERIN EX PADS: 1.0000 "application " | MEDICATED_PAD | CUTANEOUS | Status: DC | PRN

## 2017-02-27 MED ORDER — ONDANSETRON HCL 4 MG/2ML IJ SOLN: 4.0000 mg | INTRAMUSCULAR | Status: DC | PRN

## 2017-02-27 MED ORDER — FENTANYL CITRATE (PF) 100 MCG/2ML IJ SOLN
50.0000 ug | INTRAMUSCULAR | Status: DC | PRN
  Administered 2017-02-27: 50 ug via INTRAVENOUS
  Administered 2017-02-27: 100 ug via INTRAVENOUS
  Filled 2017-02-27 (×3): qty 2

## 2017-02-27 MED ORDER — FENTANYL 2.5 MCG/ML BUPIVACAINE 1/10 % EPIDURAL INFUSION (WH - ANES)
INTRAMUSCULAR | Status: AC
  Filled 2017-02-27: qty 100

## 2017-02-27 MED ORDER — SIMETHICONE 80 MG PO CHEW: 80.0000 mg | CHEWABLE_TABLET | ORAL | Status: DC | PRN

## 2017-02-27 MED ORDER — EPHEDRINE 5 MG/ML INJ
10.0000 mg | INTRAVENOUS | Status: DC | PRN
  Filled 2017-02-27: qty 2

## 2017-02-27 MED ORDER — DIPHENHYDRAMINE HCL 25 MG PO CAPS: 25.0000 mg | ORAL_CAPSULE | Freq: Four times a day (QID) | ORAL | Status: DC | PRN

## 2017-02-27 MED ORDER — OXYTOCIN 40 UNITS IN LACTATED RINGERS INFUSION - SIMPLE MED
1.0000 m[IU]/min | INTRAVENOUS | Status: DC
  Administered 2017-02-27: 2 m[IU]/min via INTRAVENOUS
  Filled 2017-02-27: qty 1000

## 2017-02-27 MED ORDER — SENNOSIDES-DOCUSATE SODIUM 8.6-50 MG PO TABS
2.0000 | ORAL_TABLET | ORAL | Status: DC
  Administered 2017-02-28 (×2): 2 via ORAL
  Filled 2017-02-27 (×2): qty 2

## 2017-02-27 MED ORDER — MISOPROSTOL 200 MCG PO TABS
ORAL_TABLET | ORAL | Status: AC
  Filled 2017-02-27: qty 5

## 2017-02-27 MED ORDER — PHENYLEPHRINE 40 MCG/ML (10ML) SYRINGE FOR IV PUSH (FOR BLOOD PRESSURE SUPPORT)
PREFILLED_SYRINGE | INTRAVENOUS | Status: AC
  Filled 2017-02-27: qty 20

## 2017-02-27 MED ORDER — TETANUS-DIPHTH-ACELL PERTUSSIS 5-2.5-18.5 LF-MCG/0.5 IM SUSP: 0.5000 mL | Freq: Once | INTRAMUSCULAR | Status: DC

## 2017-02-27 MED ORDER — LACTATED RINGERS IV SOLN: 500.0000 mL | Freq: Once | INTRAVENOUS | Status: DC

## 2017-02-27 MED ORDER — METHYLERGONOVINE MALEATE 0.2 MG/ML IJ SOLN
INTRAMUSCULAR | Status: AC
  Administered 2017-02-27: 0.2 mg via INTRAMUSCULAR
  Filled 2017-02-27: qty 1

## 2017-02-27 MED ORDER — COCONUT OIL OIL: 1.0000 "application " | TOPICAL_OIL | Status: DC | PRN

## 2017-02-27 MED ORDER — MISOPROSTOL 200 MCG PO TABS
1000.0000 ug | ORAL_TABLET | Freq: Once | ORAL | Status: AC
  Administered 2017-02-27: 1000 ug via RECTAL

## 2017-02-27 MED ORDER — PRENATAL MULTIVITAMIN CH
1.0000 | ORAL_TABLET | Freq: Every day | ORAL | Status: DC
  Administered 2017-02-28: 1 via ORAL
  Filled 2017-02-27: qty 1

## 2017-02-27 MED ORDER — SODIUM CHLORIDE 0.9 % IV SOLN: 3.0000 g | Freq: Once | INTRAVENOUS | Status: DC

## 2017-02-27 MED ORDER — DIBUCAINE 1 % RE OINT: 1.0000 "application " | TOPICAL_OINTMENT | RECTAL | Status: DC | PRN

## 2017-02-27 MED ORDER — TERBUTALINE SULFATE 1 MG/ML IJ SOLN
0.2500 mg | Freq: Once | INTRAMUSCULAR | Status: DC | PRN
  Filled 2017-02-27: qty 1

## 2017-02-27 MED ORDER — GENTAMICIN SULFATE 40 MG/ML IJ SOLN: 1.5000 mg/kg | Freq: Once | INTRAMUSCULAR | Status: DC

## 2017-02-27 MED ORDER — GENTAMICIN SULFATE 40 MG/ML IJ SOLN
Freq: Once | INTRAVENOUS | Status: AC
  Administered 2017-02-27: 21:00:00 via INTRAVENOUS
  Filled 2017-02-27: qty 2.75

## 2017-02-27 MED ORDER — FENTANYL 2.5 MCG/ML BUPIVACAINE 1/10 % EPIDURAL INFUSION (WH - ANES)
14.0000 mL/h | INTRAMUSCULAR | Status: DC | PRN
Start: 2017-02-27 — End: 2017-02-28
  Administered 2017-02-27 (×2): 14 mL/h via EPIDURAL
  Filled 2017-02-27: qty 100

## 2017-02-27 MED ORDER — DIPHENHYDRAMINE HCL 50 MG/ML IJ SOLN: 12.5000 mg | INTRAMUSCULAR | Status: DC | PRN

## 2017-02-27 MED ORDER — BENZOCAINE-MENTHOL 20-0.5 % EX AERO
1.0000 "application " | INHALATION_SPRAY | CUTANEOUS | Status: DC | PRN
  Administered 2017-02-28: 1 via TOPICAL
  Filled 2017-02-27: qty 56

## 2017-02-27 MED ORDER — LIDOCAINE HCL (PF) 1 % IJ SOLN
INTRAMUSCULAR | Status: DC | PRN
  Administered 2017-02-27 (×2): 4 mL via EPIDURAL

## 2017-02-27 MED ORDER — CLINDAMYCIN PHOSPHATE 900 MG/50ML IV SOLN: 900.0000 mg | Freq: Once | INTRAVENOUS | Status: DC

## 2017-02-27 MED ORDER — ZOLPIDEM TARTRATE 5 MG PO TABS: 5.0000 mg | ORAL_TABLET | Freq: Every evening | ORAL | Status: DC | PRN

## 2017-02-27 MED ORDER — ONDANSETRON HCL 4 MG PO TABS
4.0000 mg | ORAL_TABLET | ORAL | Status: DC | PRN
  Administered 2017-02-27: 4 mg via ORAL
  Filled 2017-02-27: qty 1

## 2017-02-27 NOTE — Anesthesia Preprocedure Evaluation (Addendum)
Anesthesia Evaluation  Patient identified by MRN, date of birth, ID band Patient awake    Reviewed: Allergy & Precautions, H&P , Patient's Chart, lab work & pertinent test results  Airway Mallampati: II  TM Distance: >3 FB Neck ROM: full    Dental  (+) Teeth Intact   Pulmonary former smoker,    breath sounds clear to auscultation       Cardiovascular  Rhythm:regular Rate:Normal     Neuro/Psych PSYCHIATRIC DISORDERS Depression    GI/Hepatic   Endo/Other    Renal/GU      Musculoskeletal   Abdominal   Peds  Hematology  (+) anemia ,   Anesthesia Other Findings       Reproductive/Obstetrics (+) Pregnancy                                                             Anesthesia Evaluation  Patient identified by MRN, date of birth, ID band Patient awake    Reviewed: Allergy & Precautions, H&P , Patient's Chart, lab work & pertinent test results  Airway Mallampati: II  TM Distance: >3 FB Neck ROM: full    Dental  (+) Teeth Intact   Pulmonary former smoker,    breath sounds clear to auscultation       Cardiovascular  Rhythm:regular Rate:Normal     Neuro/Psych    GI/Hepatic   Endo/Other    Renal/GU      Musculoskeletal   Abdominal   Peds  Hematology   Anesthesia Other Findings       Reproductive/Obstetrics (+) Pregnancy                             Anesthesia Physical Anesthesia Plan  ASA: II  Anesthesia Plan: Epidural   Post-op Pain Management:    Induction:   PONV Risk Score and Plan:   Airway Management Planned:   Additional Equipment:   Intra-op Plan:   Post-operative Plan:   Informed Consent: I have reviewed the patients History and Physical, chart, labs and discussed the procedure including the risks, benefits and alternatives for the proposed anesthesia with the patient or authorized representative who  has indicated his/her understanding and acceptance.   Dental Advisory Given  Plan Discussed with:   Anesthesia Plan Comments: (Labs checked- platelets confirmed with RN in room. Fetal heart tracing, per RN, reported to be stable enough for sitting procedure. Discussed epidural, and patient consents to the procedure:  included risk of possible headache,backache, failed block, allergic reaction, and nerve injury. This patient was asked if she had any questions or concerns before the procedure started.)        Anesthesia Quick Evaluation  Anesthesia Physical Anesthesia Plan  ASA: II  Anesthesia Plan: Epidural   Post-op Pain Management:    Induction:   PONV Risk Score and Plan:   Airway Management Planned:   Additional Equipment:   Intra-op Plan:   Post-operative Plan:   Informed Consent: I have reviewed the patients History and Physical, chart, labs and discussed the procedure including the risks, benefits and alternatives for the proposed anesthesia with the patient or authorized representative who has indicated his/her understanding and acceptance.     Plan Discussed with:   Anesthesia Plan Comments: (Labs checked- platelets confirmed  with RN in room. Fetal heart tracing, per RN, reported to be stable enough for sitting procedure. Discussed epidural, and patient consents to the procedure:  included risk of possible headache,backache, failed block, and allergic reaction.  This patient was asked if she had any questions or concerns before the procedure started.)       Anesthesia Quick Evaluation

## 2017-02-27 NOTE — Anesthesia Procedure Notes (Signed)
Epidural Patient location during procedure: OB Start time: 02/27/2017 7:14 AM  Staffing Anesthesiologist: Adele Barthel P Performed: anesthesiologist   Preanesthetic Checklist Completed: patient identified, site marked, pre-op evaluation, timeout performed, IV checked, risks and benefits discussed and monitors and equipment checked  Epidural Patient position: sitting Prep: DuraPrep Patient monitoring: heart rate, cardiac monitor, continuous pulse ox and blood pressure Approach: midline Location: L3-L4 Injection technique: LOR air  Needle:  Needle type: Tuohy  Needle gauge: 17 G Needle length: 9 cm Needle insertion depth: 6 cm Catheter type: closed end flexible Catheter size: 19 Gauge Catheter at skin depth: 11 cm Test dose: negative and Other  Assessment Events: blood not aspirated, injection not painful, no injection resistance and negative IV test  Additional Notes Informed consent obtained prior to proceeding including risk of failure, 1% risk of PDPH, risk of minor discomfort and bruising.  Discussed rare but serious complications.  Discussed alternatives to epidural analgesia and patient desires to proceed.  Timeout performed pre-procedure verifying patient name, procedure, and platelet count.  Patient tolerated procedure well. Reason for block:procedure for pain

## 2017-02-27 NOTE — Progress Notes (Signed)
Labor Progress Note  EVAN OSBURN is a 26 y.o. female G2P1001 at [redacted]w[redacted]d admitted for IOL for non-reassuring fetal surveillance post-term.  S: Patient seen & examined for progress of labor. Patient laying comfortably in bed. Starting to feel occasional contractions.   O: BP 124/84   Pulse 86   Temp 98 F (36.7 C) (Oral)   Resp 16   Ht 5\' 3"  (1.6 m)   Wt 166 lb (75.3 kg)   LMP 05/17/2016   BMI 29.41 kg/m   FHT: 135bpm, mod var, +accels, no decels TOCO: 2-52min  CVE: Dilation: 1.5 Effacement (%): 50 Station: -3 Presentation: Vertex Exam by:: Dr. Gerarda Fraction  A&P: 26 y.o. G2P1001 [redacted]w[redacted]d here for IOL for non-reassuring fetal surveillance post-term.  Labor:Cytotec x2, FB placed at 0200.  Fetal Wellbeing:Cat I Pain Control: IV pain meds Anticipated HLK:TGYBWLS  Continue expectant management  Luiz Blare, DO [facility], [place] 02/27/2017, 2:10 AM

## 2017-02-27 NOTE — Progress Notes (Signed)
LABOR PROGRESS NOTE  [place] is a 26 y.o. G2P1001 at [redacted]w[redacted]d  admitted for IOL for postterm NRHFT  Subjective: Patient doing well. Reports good pain control with epidural  Objective: BP (!) 114/91   Pulse 95   Temp 97.9 F (36.6 C) (Oral)   Resp 18   Ht 5\' 3"  (1.6 m)   Wt 166 lb (75.3 kg)   LMP 05/17/2016   BMI 29.41 kg/m  or  Vitals:   02/27/17 0740 02/27/17 0801 02/27/17 0830 02/27/17 0900  BP:      Pulse:      Resp: 16 18 16 18   Temp:      TempSrc:      Weight:      Height:        SVE Dilation: 5 Effacement (%): 80 Station: -2 Presentation: Vertex Exam by:: Degele FHT: baseline rate 120, moderate varibility, +acel, no decel Toco: ctx q-5 min  Assessment / Plan: 26 y.o. G2P1001 at [redacted]w[redacted]d here for IOL for postterm NRHFT  Labor: s/p FB and cytotec. AROM at 0900. Will start Pit Fetal Wellbeing:  Cat I Pain Control:  Epidural Anticipated MOD:  SVD  Gailen Shelter, MD 02/27/2017, 9:07 AM

## 2017-02-27 NOTE — Progress Notes (Signed)
Labor Progress Note  Karen Mcbride is a 26 y.o. female G2P1001 at [redacted]w[redacted]d admitted for IOL for non-reassuring fetal surveillance post-term.  S: Patient seen & examined for progress of labor. Patient is uncomfortable during contractions, which she says are coming about every minute.  She is requesting an epidural.  O: BP 120/75   Pulse 70   Temp 97.9 F (36.6 C) (Oral)   Resp 16   Ht 5\' 3"  (1.6 m)   Wt 75.3 kg (166 lb)   LMP 05/17/2016   BMI 29.41 kg/m   FHT: 140bpm, mod var, +accels, no decels TOCO: 2-54min  CVE: Dilation: 4 Effacement (%): 50, 60 Station: -3, -2 Presentation: Vertex Exam by:: Tia Masker, RN  A&P: 26 y.o. G2P1001 [redacted]w[redacted]d here for IOL for non-reassuring fetal surveillance post-term.  Labor:Cytotec x2, FB placed at 0200.  FB fell out at 0500.  Fetal Wellbeing:Cat I Pain Control: IV pain meds, epidural requested; will be placed soon Anticipated OXB:DZHGDJM  Continue induction.  Numan Zylstra C. Shan Levans, MD PGY-1, [facility] 02/27/2017 6:42 AM

## 2017-02-27 NOTE — Progress Notes (Cosign Needed)
Karen Mcbride is a 27 y.o. G2P1001 at [redacted]w[redacted]d admitted for IOL for post dates and NRFHT outpatient.   Subjective: Patient doing well.  Mom at bedside for support.  Reports that she began feeling "pressure in my bottom" 10-15 minutes ago.  She is comfortable with epidural in place.   Objective: BP (!) 114/91   Pulse 95   Temp 98.8 F (37.1 C) (Oral)   Resp 16   Ht 5\' 3"  (1.6 m)   Wt 75.3 kg (166 lb)   LMP 05/17/2016   BMI 29.41 kg/m   FHT:  FHR: 145 bpm, variability: moderate,  accelerations:  Present,  decelerations:  Absent UC:   regular, every 2-4 minutes SVE:   Dilation: Lip/rim Effacement (%): 100 Station: +1 Exam by:: [employer]: Lab Results  Component Value Date   WBC 4.9 02/26/2017   HGB 10.2 (L) 02/26/2017   HCT 30.2 (L) 02/26/2017   MCV 87.8 02/26/2017   PLT 123 (L) 02/26/2017    Assessment / Plan: Induction of labor due to non-reassuring fetal testing,  progressing well on pitocin  Labor: progressing normally on pitocin Preeclampsia:  N/A Fetal Wellbeing:  Category I Pain Control:  Epidural I/D:  n/a Anticipated MOD:  NSVD  Duke Salvia 02/27/2017, 4:18 PM

## 2017-02-28 ENCOUNTER — Inpatient Hospital Stay (HOSPITAL_COMMUNITY): Admission: RE | Admit: 2017-02-28 | Payer: Medicaid Other | Source: Ambulatory Visit

## 2017-02-28 LAB — CBC
HCT: 27.5 % — ABNORMAL LOW (ref 36.0–46.0)
Hemoglobin: 9.5 g/dL — ABNORMAL LOW (ref 12.0–15.0)
MCH: 29.9 pg (ref 26.0–34.0)
MCHC: 34.5 g/dL (ref 30.0–36.0)
MCV: 86.5 fL (ref 78.0–100.0)
PLATELETS: 89 10*3/uL — AB (ref 150–400)
RBC: 3.18 MIL/uL — AB (ref 3.87–5.11)
RDW: 14.8 % (ref 11.5–15.5)
WBC: 10 10*3/uL (ref 4.0–10.5)

## 2017-02-28 LAB — COMPREHENSIVE METABOLIC PANEL
ALK PHOS: 136 U/L — AB (ref 38–126)
ALT: 13 U/L — AB (ref 14–54)
ANION GAP: 10 (ref 5–15)
AST: 33 U/L (ref 15–41)
Albumin: 2.7 g/dL — ABNORMAL LOW (ref 3.5–5.0)
BUN: 5 mg/dL — ABNORMAL LOW (ref 6–20)
CALCIUM: 8.7 mg/dL — AB (ref 8.9–10.3)
CHLORIDE: 106 mmol/L (ref 101–111)
CO2: 23 mmol/L (ref 22–32)
CREATININE: 0.74 mg/dL (ref 0.44–1.00)
Glucose, Bld: 112 mg/dL — ABNORMAL HIGH (ref 65–99)
Potassium: 3.6 mmol/L (ref 3.5–5.1)
Sodium: 139 mmol/L (ref 135–145)
Total Bilirubin: 0.8 mg/dL (ref 0.3–1.2)
Total Protein: 6 g/dL — ABNORMAL LOW (ref 6.5–8.1)

## 2017-02-28 LAB — PROTEIN / CREATININE RATIO, URINE
CREATININE, URINE: 103 mg/dL
Protein Creatinine Ratio: 0.15 mg/mg{Cre} (ref 0.00–0.15)
TOTAL PROTEIN, URINE: 15 mg/dL

## 2017-02-28 MED ORDER — OXYCODONE-ACETAMINOPHEN 5-325 MG PO TABS
1.0000 | ORAL_TABLET | Freq: Four times a day (QID) | ORAL | Status: DC | PRN
  Administered 2017-02-28 (×2): 1 via ORAL
  Filled 2017-02-28 (×2): qty 1

## 2017-02-28 NOTE — Progress Notes (Signed)
Patient ordered Cytotec in Labor and Delivery for increased bleeding after delivery. After transferring to postpartum unit the Labor and Delivery orders were not discontinued per protocol. Cytotec 50 mcg PO every 4  hours was still being given. Kim Booker,CNM  discontinued the orders at 0727 prior to me speaking with her about the situation.

## 2017-02-28 NOTE — Progress Notes (Signed)
Notified Dr. Shan Levans of CBC results that were ordered this morning.

## 2017-02-28 NOTE — Plan of Care (Signed)
Problem: Bowel/Gastric: Goal: Gastrointestinal status will improve No BM since Monday, enc fluids and ambulating in hallway, patient already ordered stool softeners.

## 2017-02-28 NOTE — Progress Notes (Signed)
Notified Karen Mcbride of urine protein creatine results. No new orders. Maxwell Caul, Leretha Dykes [place]

## 2017-02-28 NOTE — Anesthesia Postprocedure Evaluation (Signed)
Anesthesia Post Note  Patient: Karen Mcbride  Procedure(s) Performed: * No procedures listed *     Patient location during evaluation: Mother Baby Anesthesia Type: Epidural Level of consciousness: awake and alert and oriented Pain management: satisfactory to patient Vital Signs Assessment: post-procedure vital signs reviewed and stable Respiratory status: spontaneous breathing and nonlabored ventilation Cardiovascular status: stable Postop Assessment: no headache, no backache, no signs of nausea or vomiting, adequate PO intake and patient able to bend at knees (patient up walking) Anesthetic complications: no    Last Vitals:  Vitals:   02/28/17 0016 02/28/17 0848  BP: 130/85 112/69  Pulse: 85 77  Resp: 16 18  Temp: 37.3 C 37.9 C    Last Pain:  Vitals:   02/28/17 0848  TempSrc: Oral  PainSc: 5    Pain Goal: Patients Stated Pain Goal: 0 (02/26/17 1237)               Willa Rough

## 2017-02-28 NOTE — Progress Notes (Addendum)
Post Partum Day 1  Subjective:  Karen Mcbride is a 26 y.o. [id number] [redacted]w[redacted]d s/p NSVD.  No acute events overnight.  Pt denies problems with ambulating or po intake. She is experiencing urinary incontinence. She reports nausea and vomiting yesterday after giving birth but denies these symptoms this morning.  Pain is moderately controlled with tylenol, patient is still having back pain from epidural and would like to have something stronger like the percocet she first received but she did not accept her second dose of percocet as she was in the room with her baby alone and she claimed the percocet made her too drowsy. She has had flatus. She has not had bowel movement.  Lochia Moderate.  Plan for birth control is nexplanon.  Method of Feeding: bottle.  Patient continued receiving cytotec postpartum last night as order was not discontinued  Objective: BP 130/85 (BP Location: Right Arm)   Pulse 85   Temp 99.2 F (37.3 C) (Oral)   Resp 16   Ht 5\' 3"  (1.6 m)   Wt 75.3 kg (166 lb)   LMP 05/17/2016   Breastfeeding? Unknown   BMI 29.41 kg/m   Physical Exam:  General: alert, cooperative and no distress Lochia:normal flow Chest: CTAB Heart: RRR no m/r/g Abdomen: soft, nontender, fundus firm at/below umbilicus Uterine Fundus: firm, below the umbilicus DVT Evaluation: No evidence of DVT seen on physical exam. Extremities: mild edema   Recent Labs  02/27/17 2152 02/28/17 0545  HGB 10.8* 9.5*  HCT 31.6* 27.5*  Plt 109  89  Assessment/Plan:  ASSESSMENT: Karen Mcbride is a 26 y.o. G2P2002 [redacted]w[redacted]d ppd #1 s/p NSVD doing well.   Fever yesterday like a result of continued cytotec administration. Report to safety zone portal.  Orders for CMP added to CBC were placed for thombocytopenia. Pt also had a few elevated BP so a protein/creatinine order was placed.  Patient denied lactation consult. Plan for discharge tomorrow   LOS: 2 days   Victoria Student 02/28/2017, 6:47  AM   I spoke with and examined patient and agree with resident/PA/SNM's note and plan of care, with the exception of 'fever yesterday likely result of continued cytotec'. Discussed w/ student, immediate pp fever could have resulted from the 1,085mcg pr she received at delivery for the pph. Pt did continue to receive cytotec 66mcg po q 4hr throughout the night d/t L&D orders not being d/c'd when transferred to MB>RN to complete safety zone portal. Temp was 102.1 1hr pp, received 1 dose clinda/gent d/t removal of clots from LUS. Tmax overnight 100.2. Will continue to monitor.  Hgb 10.2>10.8>9.5, asymptomatic. Drinking, voiding, ambulating well. Some urinary incontinence on way to br, feels she empties bladder fully once to toilet. Discussed kegels. Getting ready to eat breakfast, ate some crackers last night. +flatus.  Lochia and pain wnl. Some pain in lower back where epidural was, has been using heat packs which help.  Denies dizziness, lightheadedness, or sob.  BP 114/91 8/28 @ 0731, no bp's filed again until 8/28 @ 1845 at which time pt was immediate pp and bp was  152/67, repeat 140/77. Denies problems w/ bp during pregnancy. Some mild headaches, nothing bad enough to take meds for. Denies visual changes, ruq/epigastric pain, current n/v- had some nausea last night. Platelet count 123>109>89. Add CMP and P:C ratio to this mornings labs. Monitor bp today. Discussed pre-e s/s to report to nurse. Bottlefeeding FF u-1, lochia wnl Discussed all w/ Dr. Newton/oncoming shift   Joelene Millin  Gardner Candle, CNM, WHNP-BC 02/28/2017

## 2017-02-28 NOTE — Progress Notes (Signed)
UR chart review completed.  

## 2017-03-01 MED ORDER — IBUPROFEN 600 MG PO TABS: 600.0000 mg | ORAL_TABLET | Freq: Four times a day (QID) | ORAL | 0 refills | Status: DC | PRN

## 2017-03-01 NOTE — Discharge Instructions (Signed)

## 2017-03-01 NOTE — Discharge Summary (Signed)
Obstetric Discharge Summary Reason for Admission: induction of labor for NRFHR, gHTN- some elevated BPs noted after delivery Prenatal Procedures: none Intrapartum Procedures: NSVD Postpartum Procedures: none Complications-Operative and Postpartum: hemorrhage - EBL 1000cc, given 1047mcg cytotec rectally Hemoglobin  Date Value Ref Range Status  02/28/2017 9.5 (L) 12.0 - 15.0 g/dL Final  12/01/2016 10.8 (L) 11.1 - 15.9 g/dL Final   HCT  Date Value Ref Range Status  02/28/2017 27.5 (L) 36.0 - 46.0 % Final   Hematocrit  Date Value Ref Range Status  12/01/2016 32.6 (L) 34.0 - 46.6 % Final   Hospital course: Ms Lashway is a 26yo G2P1001 admitted at 40.5wks due to nonreassuring fetal surveillance post term. She was induced using cytotec, cervical foley, Pitocin and AROM and progressed to SVD. See delivery note re 20 second shoulder dystocia. She had an immediate PPH that resolved with fundal massage and 1037mcg cytotec rectally. She was given a dose of Gent/Clinda due to extensive manual clot removal at the time when she had a temp of 102.1 an hour after delivery. On PPD#1 she was noted to have had some intermittent elevated BPs of 140/77 and 139/86 during the night, and so pre-e labs were collected and were normal other than a decrease plt count to 89 from 109 on admission. By PPD#2 her BPs were normal 110-120/70s and she was deemed to have received the full benefit of her hospital stay and was discharged home.  Physical Exam:  General: alert, cooperative and no distress Lochia: appropriate Uterine Fundus: firm DVT Evaluation: No evidence of DVT seen on physical exam.  Discharge Diagnoses: Term Pregnancy-delivered  Discharge Information: Date: 03/01/2017 Activity: pelvic rest Diet: routine Medications: None Condition: stable Instructions: refer to practice specific booklet Discharge to: home Follow-up Information    [facility]. Schedule an appointment as soon as possible for a  visit in 6 week(s).   Specialty:  Obstetrics and Gynecology Contact information: [address] [place] [phone]          Newborn Data: Live born female  Birth Weight: 8 lb 5.9 oz (3795 g) APGAR: 8, 9  Desires nexplanon for birth control  Home with mother.  Steve Rattler 03/01/2017, 7:23 AM   CNM attestation I have seen and examined this patient and agree with above documentation in the resident's note [facility] added in by me).   [place] L Biscardi is a 26 y.o. [id number] s/p SVD after IOL for NRFHR.   Pain is well controlled.  Plan for birth control is Nexplanon.  Method of Feeding: both  PE:  BP 113/73 (BP Location: Left Arm)   Pulse 65   Temp 99.5 F (37.5 C) (Oral)   Resp 16   Ht 5\' 3"  (1.6 m)   Wt 75.3 kg (166 lb)   LMP 05/17/2016   Breastfeeding? Unknown   BMI 29.41 kg/m  Fundus firm   Recent Labs  02/27/17 2152 02/28/17 0545  HGB 10.8* 9.5*  HCT 31.6* 27.5*     Plan: discharge today - postpartum care discussed - f/u clinic in 4 weeks for postpartum visit   Serita Grammes, CNM 7:44 AM 03/01/2017

## 2017-04-02 ENCOUNTER — Encounter: Payer: Self-pay | Admitting: Certified Nurse Midwife

## 2017-04-02 ENCOUNTER — Ambulatory Visit (INDEPENDENT_AMBULATORY_CARE_PROVIDER_SITE_OTHER): Payer: Medicaid Other | Admitting: Certified Nurse Midwife

## 2017-04-02 DIAGNOSIS — Z1389 Encounter for screening for other disorder: Secondary | ICD-10-CM

## 2017-04-02 NOTE — Progress Notes (Signed)
Post Partum Exam  Karen Mcbride is a 26 y.o. G106P2002 female who presents for a postpartum visit. She is 5 weeks postpartum following a spontaneous vaginal delivery. I have fully reviewed the prenatal and intrapartum course. The delivery was at 40.6 gestational weeks.  Anesthesia: epidural. Postpartum course has been doing well. Baby's course has been doing well. Baby is feeding by bottle - Similac Advance. Bleeding no bleeding. Bowel function is normal. Bladder function is normal. Patient is not sexually active. Contraception method is abstinence. Interested in [place] at this time. Postpartum depression screening:neg, score 0.  The following portions of the patient's history were reviewed and updated as appropriate: allergies, current medications, past family history, past medical history, past social history, past surgical history and problem list.  Review of Systems Pertinent items noted in HPI and remainder of comprehensive ROS otherwise negative.    Objective:  unknown if currently breastfeeding.  General:  alert, cooperative and no distress   Breasts:  inspection negative, no nipple discharge or bleeding, no masses or nodularity palpable  Lungs: clear to auscultation bilaterally  Heart:  regular rate and rhythm, S1, S2 normal, no murmur, click, rub or gallop  Abdomen: soft, non-tender; bowel sounds normal; no masses,  no organomegaly  Pelvic Exam: Not performed.        Assessment:    Normal 4 week postpartum exam. Pap smear not done at today's visit.  Contraception counseling: nexplanon  Plan:   1. Contraception: abstinence 2. Discussed Nexplanon 3. Follow up in: 2 weeks for Nexplanon or as needed.

## 2017-04-05 ENCOUNTER — Telehealth: Payer: Self-pay

## 2017-04-05 NOTE — Telephone Encounter (Signed)
Returned call, patient stated that she needs a return to work note, with provider approval, advised that I would leave it at the front for pick up.

## 2017-04-18 ENCOUNTER — Ambulatory Visit: Payer: Medicaid Other | Admitting: Certified Nurse Midwife

## 2018-04-23 ENCOUNTER — Emergency Department (HOSPITAL_COMMUNITY)
Admission: EM | Admit: 2018-04-23 | Discharge: 2018-04-24 | Disposition: A | Discharge status: Home / Self Care | Payer: No Typology Code available for payment source | Attending: Emergency Medicine | Admitting: Emergency Medicine

## 2018-04-23 ENCOUNTER — Encounter (HOSPITAL_COMMUNITY): Payer: Self-pay | Admitting: Emergency Medicine

## 2018-04-23 ENCOUNTER — Ambulatory Visit (INDEPENDENT_AMBULATORY_CARE_PROVIDER_SITE_OTHER)
Admission: EM | Admit: 2018-04-23 | Discharge: 2018-04-23 | Disposition: A | Discharge status: Home / Self Care | Payer: No Typology Code available for payment source | Source: Home / Self Care | Attending: Family Medicine | Admitting: Family Medicine

## 2018-04-23 ENCOUNTER — Other Ambulatory Visit: Payer: Self-pay

## 2018-04-23 ENCOUNTER — Encounter (HOSPITAL_COMMUNITY): Payer: Self-pay | Admitting: *Deleted

## 2018-04-23 DIAGNOSIS — Z79899 Other long term (current) drug therapy: Secondary | ICD-10-CM | POA: Insufficient documentation

## 2018-04-23 DIAGNOSIS — J020 Streptococcal pharyngitis: Secondary | ICD-10-CM | POA: Insufficient documentation

## 2018-04-23 DIAGNOSIS — B9789 Other viral agents as the cause of diseases classified elsewhere: Secondary | ICD-10-CM

## 2018-04-23 DIAGNOSIS — F17210 Nicotine dependence, cigarettes, uncomplicated: Secondary | ICD-10-CM | POA: Insufficient documentation

## 2018-04-23 DIAGNOSIS — J029 Acute pharyngitis, unspecified: Secondary | ICD-10-CM

## 2018-04-23 LAB — POCT RAPID STREP A: Streptococcus, Group A Screen (Direct): NEGATIVE

## 2018-04-23 LAB — GROUP A STREP BY PCR: Group A Strep by PCR: DETECTED — AB

## 2018-04-23 MED ORDER — PENICILLIN G BENZATHINE 1200000 UNIT/2ML IM SUSP
1.2000 10*6.[IU] | Freq: Once | INTRAMUSCULAR | Status: AC
  Administered 2018-04-24: 1.2 10*6.[IU] via INTRAMUSCULAR
  Filled 2018-04-23: qty 2

## 2018-04-23 MED ORDER — LIDOCAINE VISCOUS HCL 2 % MT SOLN: 15.0000 mL | OROMUCOSAL | 0 refills | Status: DC | PRN

## 2018-04-23 MED ORDER — CETIRIZINE-PSEUDOEPHEDRINE ER 5-120 MG PO TB12: 1.0000 | ORAL_TABLET | Freq: Every day | ORAL | 0 refills | Status: DC

## 2018-04-23 MED ORDER — ACETAMINOPHEN 500 MG PO TABS
1000.0000 mg | ORAL_TABLET | Freq: Once | ORAL | Status: AC
  Administered 2018-04-24: 1000 mg via ORAL
  Filled 2018-04-23: qty 2

## 2018-04-23 MED ORDER — FLUTICASONE PROPIONATE 50 MCG/ACT NA SUSP: 1.0000 | Freq: Every day | NASAL | 2 refills | Status: DC

## 2018-04-23 NOTE — Discharge Instructions (Signed)
Your rapid strep is negative  We will send for culture I am prescribing you some vicious lidocaine for pain in your throat I would like for ou to take zyrtec D for the congestion and drainage in your throat.  I would also like for you to try Flonase nasal spray. You can take ibuprofen or tylenol for pain Follow up as needed for continued or worsening symptoms

## 2018-04-23 NOTE — ED Triage Notes (Signed)
Pt c/o sore throat x 2 days, seen at urgent care for same. Reports strep was negative, pt is insistent she has strep. Painful to swallow.

## 2018-04-23 NOTE — ED Provider Notes (Signed)
[place]    CSN: [id number] Arrival date & time: 04/23/18  0845     History   Chief Complaint Chief Complaint  Patient presents with  . Sore Throat    HPI [place] L Gahm is a 27 y.o. female.    URI  Presenting symptoms: congestion, rhinorrhea and sore throat   Severity:  Moderate Onset quality:  Gradual Duration:  2 days Timing:  Constant Progression:  Worsening Chronicity:  New Relieved by:  Nothing Worsened by:  Drinking and eating Ineffective treatments:  None tried Associated symptoms: no arthralgias, no headaches, no myalgias, no neck pain, no sinus pain, no sneezing, no swollen glands and no wheezing   Risk factors: sick contacts     Past Medical History:  Diagnosis Date  . Depression   . Genital herpes     Patient Active Problem List   Diagnosis Date Noted  . Postpartum hemorrhage 02/27/2017  . Shoulder dystocia during labor and delivery 02/27/2017  . Low vitamin D level 09/28/2016    Past Surgical History:  Procedure Laterality Date  . TONSILLECTOMY      OB History    Gravida  2   Para  2   Term  2   Preterm      AB      Living  2     SAB      TAB      Ectopic      Multiple  0   Live Births  2            Home Medications    Prior to Admission medications   Medication Sig Start Date End Date Taking? Authorizing Provider  cetirizine-pseudoephedrine (ZYRTEC-D) 5-120 MG tablet Take 1 tablet by mouth daily. 04/23/18   Lorie Melichar, Tressia Miners A, NP  fluticasone (FLONASE) 50 MCG/ACT nasal spray Place 1 spray into both nostrils daily. 04/23/18   Loura Halt A, NP  ibuprofen (ADVIL,MOTRIN) 600 MG tablet Take 1 tablet (600 mg total) by mouth every 6 (six) hours as needed. Patient not taking: Reported on 04/02/2017 03/01/17   Myrtis Ser, CNM  lidocaine (XYLOCAINE) 2 % solution Use as directed 15 mLs in the mouth or throat as needed for mouth pain. 04/23/18   Dabid Godown, Tressia Miners A, NP  Prenat-FeAsp-Meth-FA-DHA w/o A (PRENATE  PIXIE) 10-0.6-0.4-200 MG CAPS Take 1 tablet by mouth daily. 09/21/16   Morene Crocker, CNM  valACYclovir (VALTREX) 500 MG tablet Take 1 tablet (500 mg total) by mouth 2 (two) times daily. 01/23/17   Chancy Milroy, MD    Family History Family History  Problem Relation Age of Onset  . Diabetes Maternal Aunt   . Diabetes Maternal Grandfather   . Diverticulosis Mother     Social History Social History   Tobacco Use  . Smoking status: Current Every Day Smoker    Packs/day: 0.25    Years: 4.00    Pack years: 1.00    Types: Cigarettes    Last attempt to quit: 11/30/2016    Years since quitting: 1.3  . Smokeless tobacco: Never Used  Substance Use Topics  . Alcohol use: Yes    Comment: occasion  . Drug use: No     Allergies   Patient has no known allergies.   Review of Systems Review of Systems  HENT: Positive for congestion, rhinorrhea and sore throat. Negative for sinus pain and sneezing.   Respiratory: Negative for wheezing.   Musculoskeletal: Negative for arthralgias, myalgias and neck pain.  Neurological: Negative for headaches.     Physical Exam Triage Vital Signs ED Triage Vitals  Enc Vitals Group     BP 04/23/18 0855 123/74     Pulse Rate 04/23/18 0855 99     Resp 04/23/18 0855 18     Temp 04/23/18 0855 98.3 F (36.8 C)     Temp Source 04/23/18 0855 Oral     SpO2 04/23/18 0855 100 %     Weight --      Height --      Head Circumference --      Peak Flow --      Pain Score 04/23/18 0856 10     Pain Loc --      Pain Edu? --      Excl. in [place]? --    No data found.  Updated Vital Signs BP 123/74 (BP Location: Left Arm)   Pulse 99   Temp 98.3 F (36.8 C) (Oral)   Resp 18   LMP 04/03/2018 (Exact Date)   SpO2 100%   Visual Acuity Right Eye Distance:   Left Eye Distance:   Bilateral Distance:    Right Eye Near:   Left Eye Near:    Bilateral Near:     Physical Exam  Constitutional: She appears well-developed and well-nourished.  Very  pleasant. Non toxic or ill appearing.   HENT:  Head: Normocephalic and atraumatic.  Right Ear: Hearing normal.  Left Ear: Hearing normal.  Mouth/Throat: Mucous membranes are normal.  Bilateral cerumen impaction Posterior oropharynx erythematous without tonsillar swelling or exudates.  Drainage noted Moderate bilateral nasal turbinate swelling No adenopathy  Neck: Normal range of motion.  Cardiovascular: Normal rate, regular rhythm and normal heart sounds.  Pulmonary/Chest: Effort normal and breath sounds normal.  Lungs clear in all fields. No dyspnea or distress. No retractions or nasal flaring.   Neurological: She is alert.  Skin: Skin is warm and dry.  Psychiatric: She has a normal mood and affect.  Nursing note and vitals reviewed.    UC Treatments / Results  Labs (all labs ordered are listed, but only abnormal results are displayed) Labs Reviewed  CULTURE, GROUP A STREP [facility])  POCT RAPID STREP A    EKG None  Radiology No results found.  Procedures Procedures (including critical care time)  Medications Ordered in UC Medications - No data to display  Initial Impression / Assessment and Plan / UC Course  I have reviewed the triage vital signs and the nursing notes.  Pertinent labs & imaging results that were available during my care of the patient were reviewed by me and considered in my medical decision making (see chart for details).     Rapid strep negative, will send for culture Most likely viral illness We will prescribe viscous lidocaine for sore throat Zyrtec-D for congestion and drainage Flonase nasal spray Follow up as needed for continued or worsening symptoms    Final Clinical Impressions(s) / UC Diagnoses   Final diagnoses:  Viral pharyngitis     Discharge Instructions     Your rapid strep is negative  We will send for culture I am prescribing you some vicious lidocaine for pain in your throat I would like for ou to take zyrtec D for  the congestion and drainage in your throat.  I would also like for you to try Flonase nasal spray. You can take ibuprofen or tylenol for pain Follow up as needed for continued or worsening symptoms     ED  Prescriptions    Medication Sig Dispense Auth. Provider   cetirizine-pseudoephedrine (ZYRTEC-D) 5-120 MG tablet Take 1 tablet by mouth daily. 30 tablet Binnie Droessler A, NP   fluticasone (FLONASE) 50 MCG/ACT nasal spray Place 1 spray into both nostrils daily. 16 g Keir Viernes A, NP   lidocaine (XYLOCAINE) 2 % solution Use as directed 15 mLs in the mouth or throat as needed for mouth pain. 100 mL Loura Halt A, NP     Controlled Substance Prescriptions [place] Controlled Substance Registry consulted? Not Applicable   Orvan July, NP 04/23/18 [phone]

## 2018-04-23 NOTE — ED Triage Notes (Signed)
yest started with nasal congestion and sorethroat. Today hurts to swallow.

## 2018-04-23 NOTE — ED Provider Notes (Signed)
[facility] EMERGENCY DEPARTMENT Provider Note   CSN: [id number] Arrival date & time: 04/23/18  2210     History   Chief Complaint Chief Complaint  Patient presents with  . Sore Throat    HPI Karen Mcbride is a 27 y.o. female.  Patient presents to the emergency department with a chief complaint of sore throat.  She states symptoms started 2 days ago.  She denies any fevers or chills.  She does report rhinorrhea, postnasal drip, and dry cough.  She states that she feels like she has strep throat.  She was seen in urgent care earlier today, but had a negative strep test.  She states that she has been unable to get any relief of her symptoms.  States that it hurts to swallow, but she is able to swallow.  The history is provided by the patient. No language interpreter was used.    Past Medical History:  Diagnosis Date  . Depression   . Genital herpes     Patient Active Problem List   Diagnosis Date Noted  . Postpartum hemorrhage 02/27/2017  . Shoulder dystocia during labor and delivery 02/27/2017  . Low vitamin D level 09/28/2016    Past Surgical History:  Procedure Laterality Date  . TONSILLECTOMY       OB History    Gravida  2   Para  2   Term  2   Preterm      AB      Living  2     SAB      TAB      Ectopic      Multiple  0   Live Births  2            Home Medications    Prior to Admission medications   Medication Sig Start Date End Date Taking? Authorizing Provider  cetirizine-pseudoephedrine (ZYRTEC-D) 5-120 MG tablet Take 1 tablet by mouth daily. 04/23/18   Bast, Tressia Miners A, NP  fluticasone (FLONASE) 50 MCG/ACT nasal spray Place 1 spray into both nostrils daily. 04/23/18   Loura Halt A, NP  ibuprofen (ADVIL,MOTRIN) 600 MG tablet Take 1 tablet (600 mg total) by mouth every 6 (six) hours as needed. Patient not taking: Reported on 04/02/2017 03/01/17   Myrtis Ser, CNM  lidocaine (XYLOCAINE) 2 % solution Use as  directed 15 mLs in the mouth or throat as needed for mouth pain. 04/23/18   Bast, Tressia Miners A, NP  Prenat-FeAsp-Meth-FA-DHA w/o A (PRENATE PIXIE) 10-0.6-0.4-200 MG CAPS Take 1 tablet by mouth daily. 09/21/16   Morene Crocker, CNM  valACYclovir (VALTREX) 500 MG tablet Take 1 tablet (500 mg total) by mouth 2 (two) times daily. 01/23/17   Chancy Milroy, MD    Family History Family History  Problem Relation Age of Onset  . Diabetes Maternal Aunt   . Diabetes Maternal Grandfather   . Diverticulosis Mother     Social History Social History   Tobacco Use  . Smoking status: Current Every Day Smoker    Packs/day: 0.25    Years: 4.00    Pack years: 1.00    Types: Cigarettes    Last attempt to quit: 11/30/2016    Years since quitting: 1.3  . Smokeless tobacco: Never Used  Substance Use Topics  . Alcohol use: Yes    Comment: occasion  . Drug use: No     Allergies   Patient has no known allergies.   Review of Systems Review  of Systems  All other systems reviewed and are negative.    Physical Exam Updated Vital Signs BP 120/77   Pulse (!) 101   Temp 98.2 F (36.8 C)   Resp 18   LMP 04/03/2018 (Exact Date)   SpO2 98%   Physical Exam  Constitutional: She is oriented to person, place, and time. She appears well-developed and well-nourished.  HENT:  Head: Normocephalic and atraumatic.  Minimally erythematous oropharynx, no tonsillar exudate, no abscess, uvula is midline, airway intact, no stridor  Eyes: Pupils are equal, round, and reactive to light. Conjunctivae and EOM are normal.  Neck: Normal range of motion. Neck supple.  Cardiovascular: Normal rate and regular rhythm. Exam reveals no gallop and no friction rub.  No murmur heard. Pulmonary/Chest: Effort normal and breath sounds normal. No respiratory distress. She has no wheezes. She has no rales. She exhibits no tenderness.  Sounds are clear to auscultation bilaterally  Abdominal: Soft. Bowel sounds are normal. She  exhibits no distension and no mass. There is no tenderness. There is no rebound and no guarding.  Musculoskeletal: Normal range of motion. She exhibits no edema or tenderness.  Neurological: She is alert and oriented to person, place, and time.  Skin: Skin is warm and dry.  Psychiatric: She has a normal mood and affect. Her behavior is normal. Judgment and thought content normal.  Nursing note and vitals reviewed.    ED Treatments / Results  Labs (all labs ordered are listed, but only abnormal results are displayed) Labs Reviewed  GROUP A STREP BY PCR    EKG None  Radiology No results found.  Procedures Procedures (including critical care time)  Medications Ordered in ED Medications  acetaminophen (TYLENOL) tablet 1,000 mg (has no administration in time range)     Initial Impression / Assessment and Plan / ED Course  I have reviewed the triage vital signs and the nursing notes.  Pertinent labs & imaging results that were available during my care of the patient were reviewed by me and considered in my medical decision making (see chart for details).     Patient was sore throat x2 days.  Strep test is positive.  Will treat with penicillin IM.   Final Clinical Impressions(s) / ED Diagnoses   Final diagnoses:  Strep throat    ED Discharge Orders    None       Montine Circle, PA-C 04/23/18 2346    Ezequiel Essex, MD 04/24/18 [phone]

## 2018-04-24 ENCOUNTER — Telehealth: Payer: Self-pay | Admitting: Emergency Medicine

## 2018-04-24 LAB — CULTURE, GROUP A STREP (THRC)

## 2018-04-24 NOTE — Telephone Encounter (Signed)
Culture is positive for group A Strep germ.  Patient received treatment at visit.  Called patient, results communicated patient voiced understanding . Recheck for further evaluation if symptoms are not improving. Marland Kitchen

## 2018-09-27 MED FILL — VALACYCLOVIR HCL 500 MG TAB: 500 | 90 days supply | Qty: 90 | Fill #0

## 2018-10-26 ENCOUNTER — Ambulatory Visit (HOSPITAL_COMMUNITY)
Admission: EM | Admit: 2018-10-26 | Discharge: 2018-10-26 | Disposition: A | Discharge status: Home / Self Care | Payer: No Typology Code available for payment source | Attending: Family Medicine | Admitting: Family Medicine

## 2018-10-26 ENCOUNTER — Other Ambulatory Visit: Payer: Self-pay

## 2018-10-26 DIAGNOSIS — R509 Fever, unspecified: Secondary | ICD-10-CM | POA: Insufficient documentation

## 2018-10-26 DIAGNOSIS — J029 Acute pharyngitis, unspecified: Secondary | ICD-10-CM | POA: Insufficient documentation

## 2018-10-26 LAB — POCT RAPID STREP A: Streptococcus, Group A Screen (Direct): NEGATIVE

## 2018-10-26 MED ORDER — AMOXICILLIN 875 MG PO TABS: 875.0000 mg | ORAL_TABLET | Freq: Two times a day (BID) | ORAL | 0 refills | Status: AC

## 2018-10-26 NOTE — ED Triage Notes (Signed)
Per pt she has hx of strep throat and yesterday she started having fevers with sore throat. Took tylenol cold and flu withy hot tea but no relief. Pt does not have fever now but has been taking otc.

## 2018-10-26 NOTE — ED Provider Notes (Signed)
[facility]   [id number] 10/26/18 Arrival Time: 7253  ASSESSMENT & PLAN:  1. Sore throat   2. Fever, unspecified fever cause    No sign of peritonsillar abscess. Discussed. Will treat empirically given exam and reported fever. Throat culture sent.  Meds ordered this encounter  Medications  . amoxicillin (AMOXIL) 875 MG tablet    Sig: Take 1 tablet (875 mg total) by mouth 2 (two) times daily for 10 days.    Dispense:  20 tablet    Refill:  0   OTC analgesics and throat care as needed  Instructed to finish full 10 day course of antibiotics. Will follow up if not showing significant improvement over the next 24-48 hours.  Reviewed expectations re: course of current medical issues. Questions answered. Outlined signs and symptoms indicating need for more acute intervention. Patient verbalized understanding. After Visit Summary given.   SUBJECTIVE:  Karen Mcbride is a 28 y.o. female who reports a sore throat. Describes as "severe pain" with swallowing. Onset abrupt beginning 1 day ago. No respiratory symptoms. Normal PO intake but reports discomfort with swallowing. Fever reported: yes; subjective with chills. No neck pain or swelling. No associated n/v/abdominal symptoms. Sick contacts: none reported. No changes in voice. OTC treatment: Tylenol with mild help.  ROS: As per HPI. All other systems negative.    OBJECTIVE:  Vitals:   10/26/18 1038  BP: 119/77  Pulse: (!) 110  Resp: 16  Temp: 98.7 F (37.1 C)  TempSrc: Oral  SpO2: 98%     General appearance: alert; no distress HEENT: throat with tonsillar hypertrophy, moderate erythema and exudates present; uvula midline: yes Neck: supple with FROM; small bilateral cervical LAD CV: RRR Lungs: clear to auscultation bilaterally Abd: soft; non-tender Skin: reveals no rash; warm and dry Psychological: alert and cooperative; normal mood and affect  No Known Allergies  Past Medical History:  Diagnosis  Date  . Depression   . Genital herpes    Social History   Socioeconomic History  . Marital status: Single    Spouse name: Not on file  . Number of children: Not on file  . Years of education: Not on file  . Highest education level: Not on file  Occupational History  . Not on file  Social Needs  . Financial resource strain: Not on file  . Food insecurity:    Worry: Not on file    Inability: Not on file  . Transportation needs:    Medical: Not on file    Non-medical: Not on file  Tobacco Use  . Smoking status: Current Every Day Smoker    Packs/day: 0.25    Years: 4.00    Pack years: 1.00    Types: Cigarettes    Last attempt to quit: 11/30/2016    Years since quitting: 1.9  . Smokeless tobacco: Never Used  Substance and Sexual Activity  . Alcohol use: Yes    Comment: occasion  . Drug use: No  . Sexual activity: Yes    Birth control/protection: None  Lifestyle  . Physical activity:    Days per week: Not on file    Minutes per session: Not on file  . Stress: Not on file  Relationships  . Social connections:    Talks on phone: Not on file    Gets together: Not on file    Attends religious service: Not on file    Active member of club or organization: Not on file    Attends meetings  of clubs or organizations: Not on file    Relationship status: Not on file  . Intimate partner violence:    Fear of current or ex partner: Not on file    Emotionally abused: Not on file    Physically abused: Not on file    Forced sexual activity: Not on file  Other Topics Concern  . Not on file  Social History Narrative  . Not on file   Family History  Problem Relation Age of Onset  . Diabetes Maternal Aunt   . Diabetes Maternal Grandfather   . Diverticulosis Mother           Vanessa Kick, MD 10/26/18 1110

## 2018-10-27 LAB — CULTURE, GROUP A STREP (THRC)

## 2018-12-12 IMAGING — US US OB COMP LESS 14 WK
1 series · 15 of 28 positions shown · non-contrast
Comparison: None.

CLINICAL DATA: Cramping 2 days ago

EXAM:
OBSTETRIC <14 WK US AND TRANSVAGINAL OB US
TECHNIQUE: Both transabdominal and transvaginal ultrasound examinations were
performed for complete evaluation of the gestation as well as the
maternal uterus, adnexal regions, and pelvic cul-de-sac.
Transvaginal technique was performed to assess early pregnancy.

[Series 1: us ob comp less 14 wk · 15 of 38 slices shown]
[im 1/38]
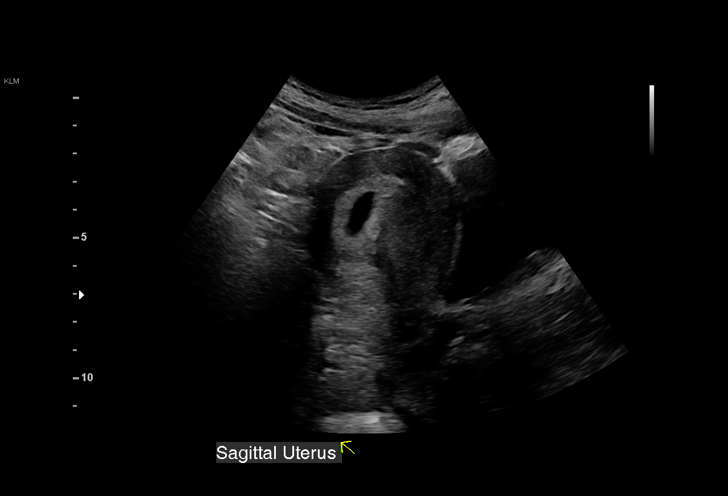
[im 3/38]
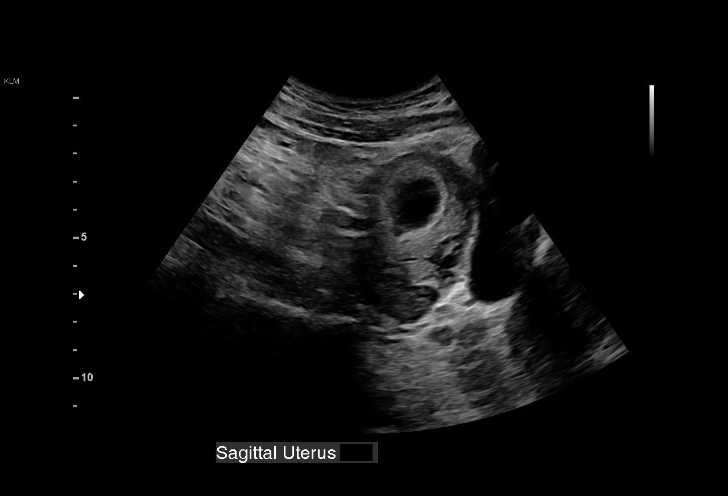
[im 6/38]
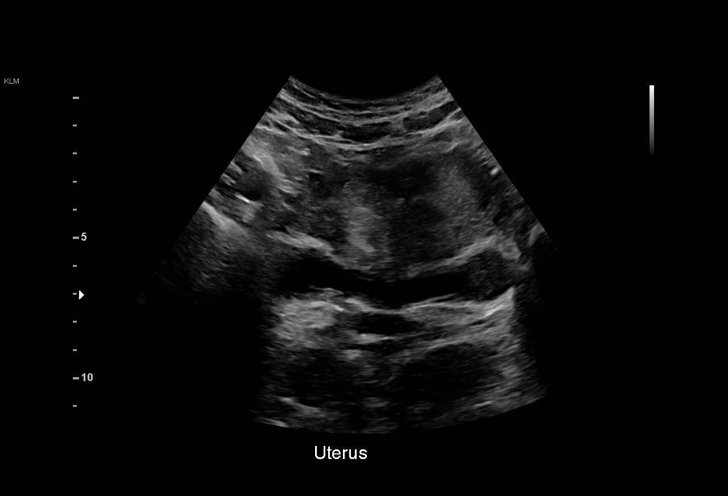
[im 9/38]
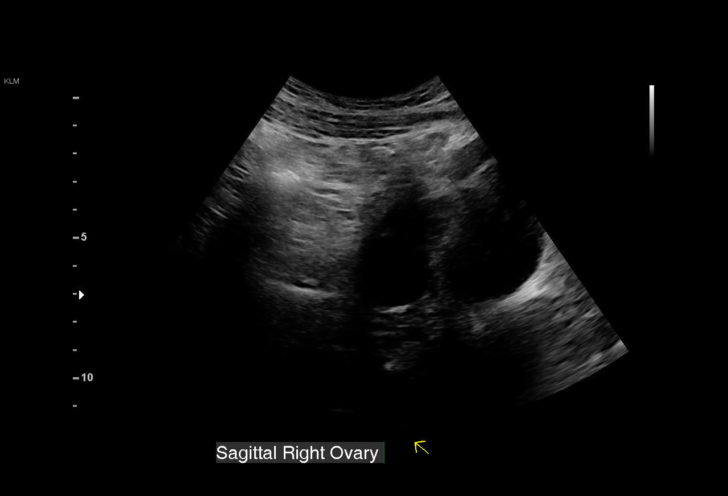
[im 11/38]
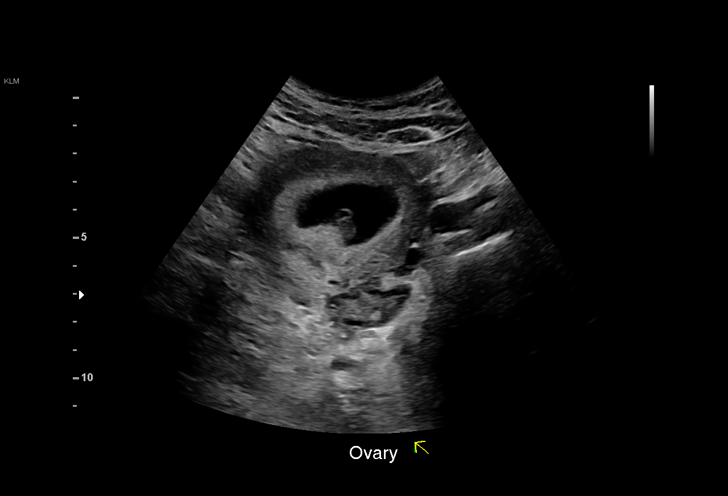
[im 14/38]
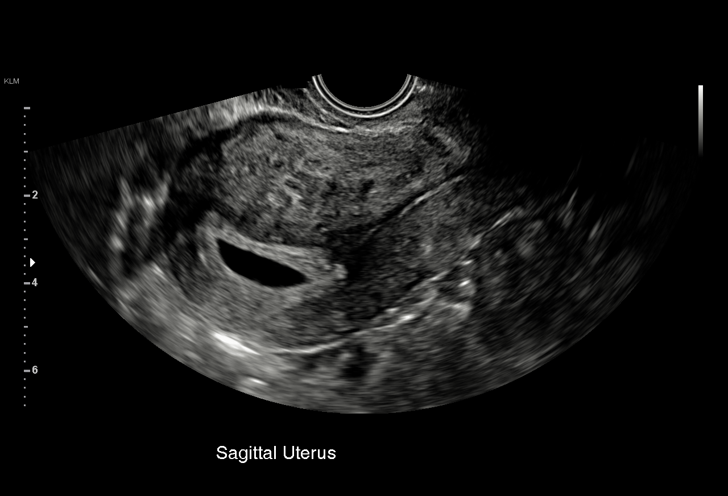
[im 17/38]
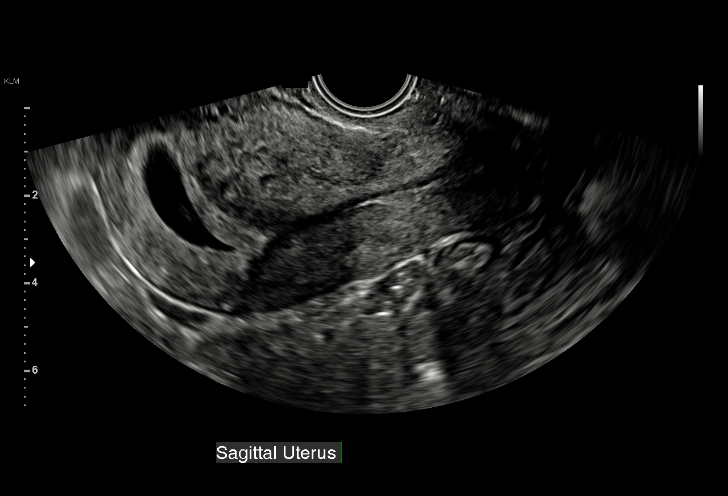
[im 20/38]
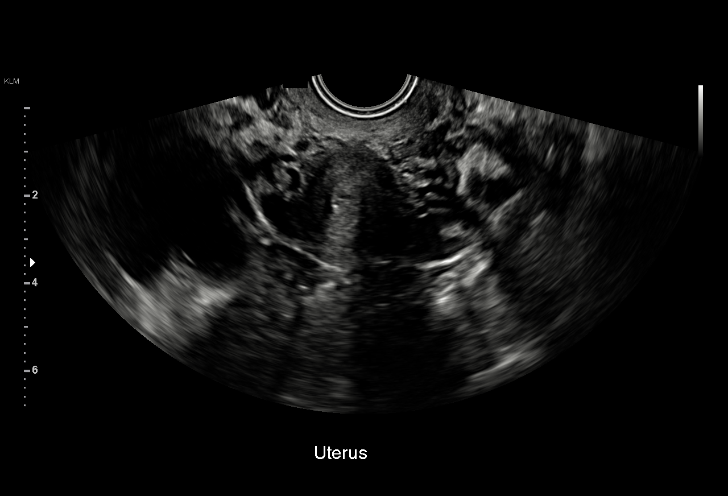
[im 21/38]
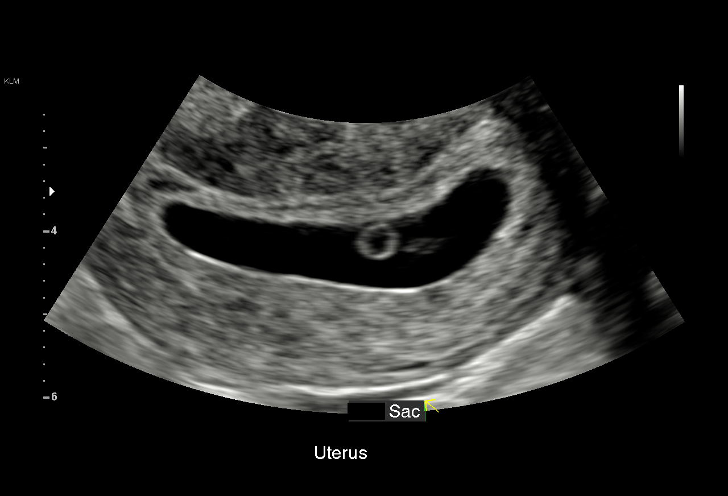
[im 24/38]
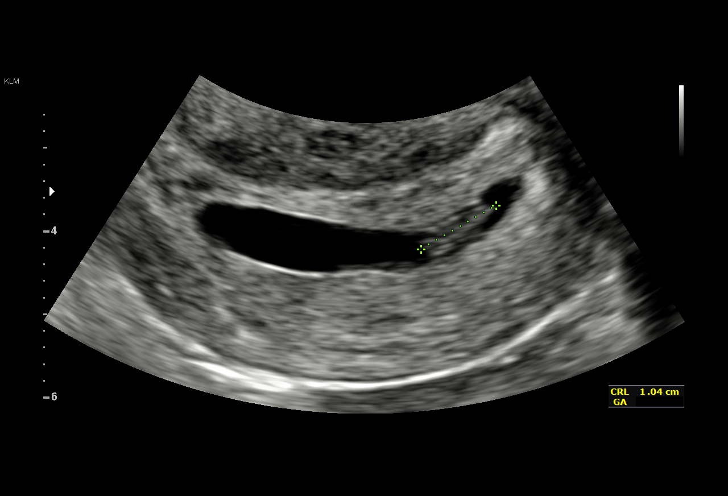
[im 27/38]
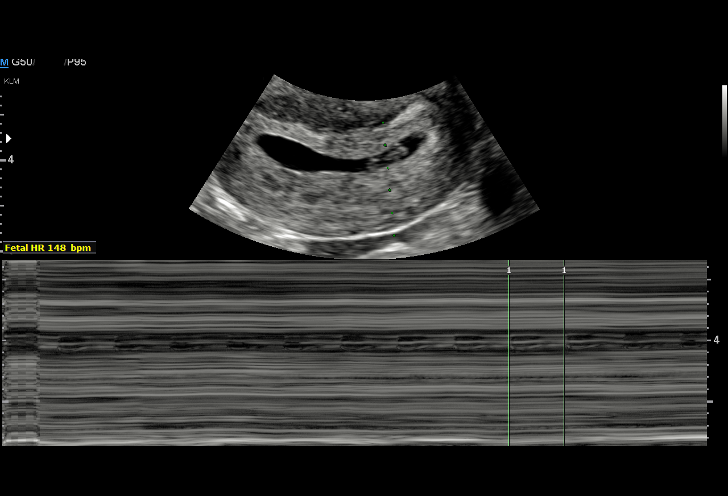
[im 29/38]
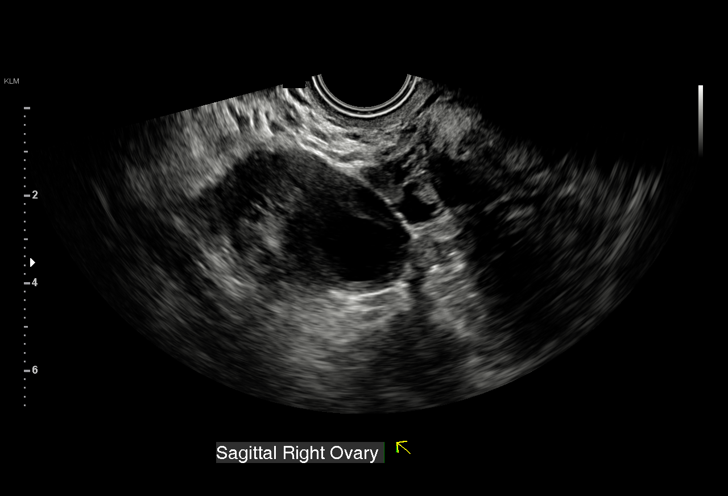
[im 32/38]
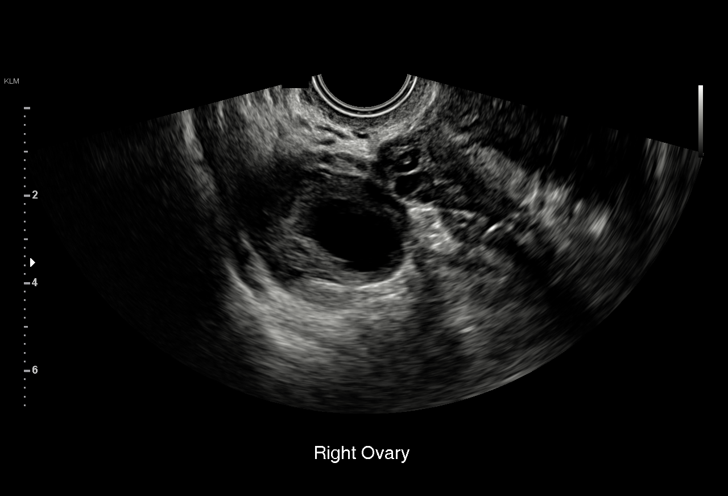
[im 35/38]
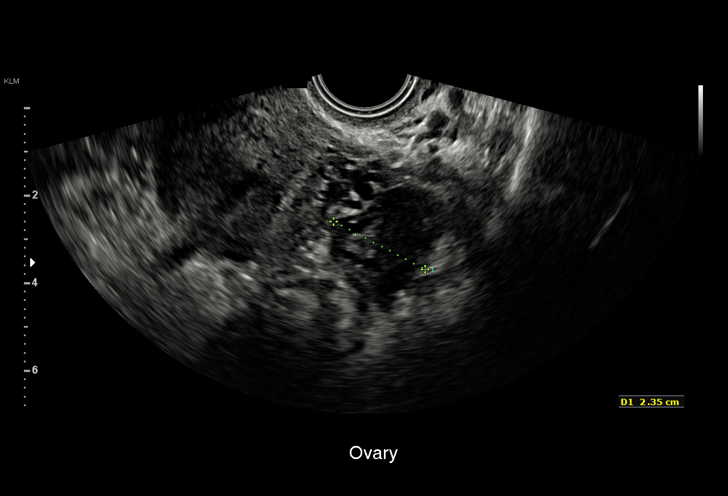
[im 38/38]
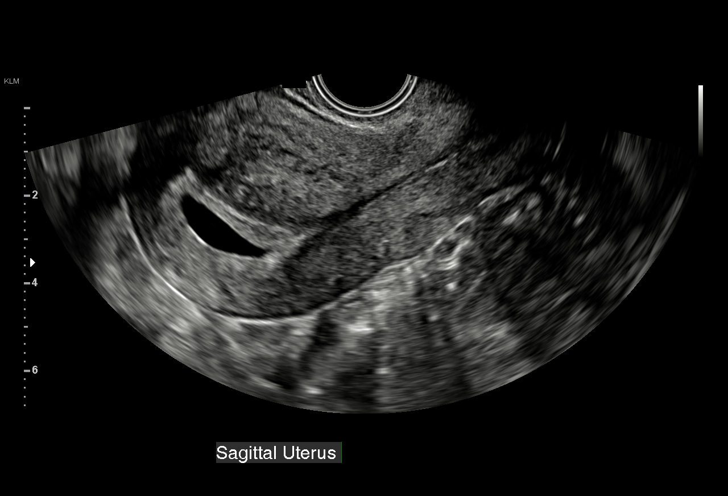

[15 of 28 positions shown; findings below may reference images not displayed]

FINDINGS: Intrauterine gestational sac: Single

Yolk sac:  Visualized

Embryo:  Visualized

Cardiac Activity: Visualized

Heart Rate: 148  bpm

MSD:   mm    w     d

CRL:  10.1  mm   7 w   1 d                  US EDC: 02/22/2017

Subchorionic hemorrhage:  None visualized.

Maternal uterus/adnexae: No adnexal mass or free fluid
IMPRESSION: Seven week 1 day intrauterine pregnancy. Fetal heart rate 148 beats
per minute. No acute maternal findings.

## 2019-02-24 MED FILL — VALACYCLOVIR HCL 500 MG TAB: 500 | 90 days supply | Qty: 90 | Fill #1

## 2019-03-03 IMAGING — US US MFM OB COMP +14 WKS
1 series · 13 of 28 positions shown · non-contrast
Comparison: none

[Series 1: us mfm ob comp +14 wks · 101 acquisitions, 13 frames shown]
[im 4/101]
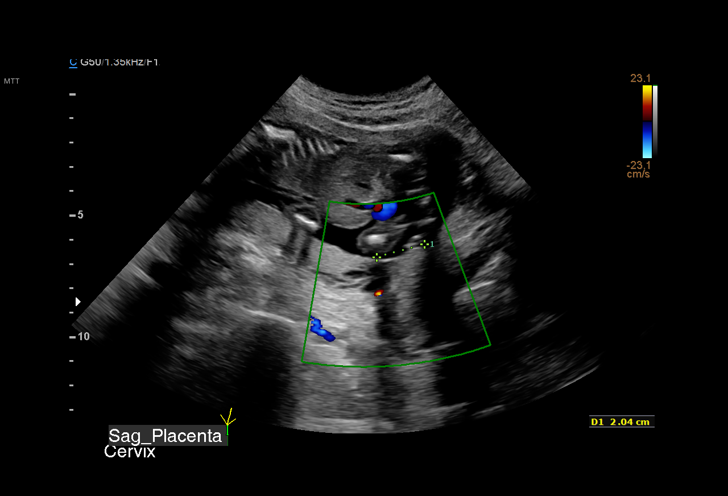
[im 12/101]
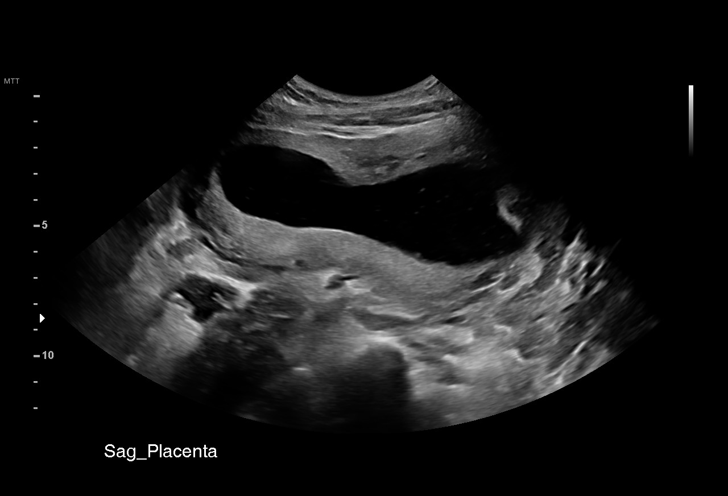
[im 19/101]
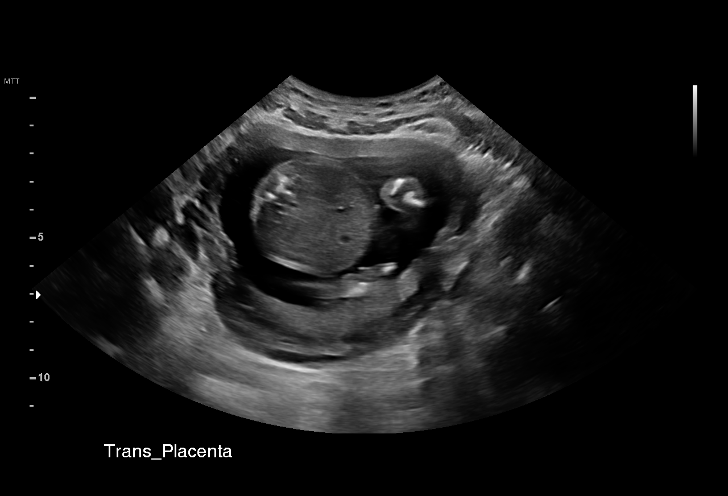
[im 26/101]
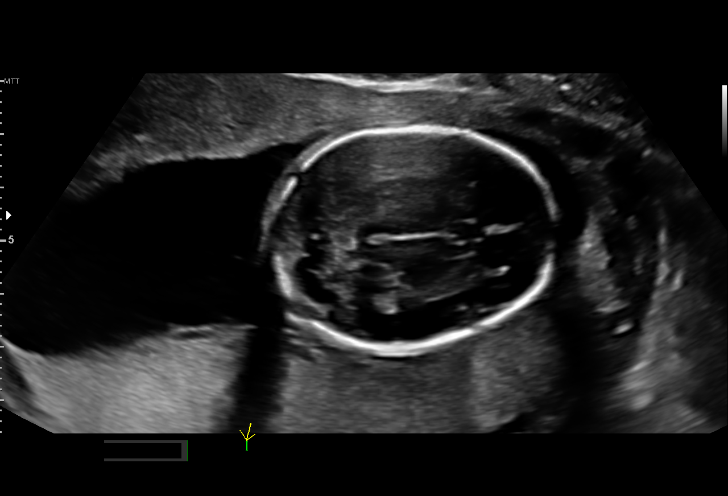
[im 34/101]
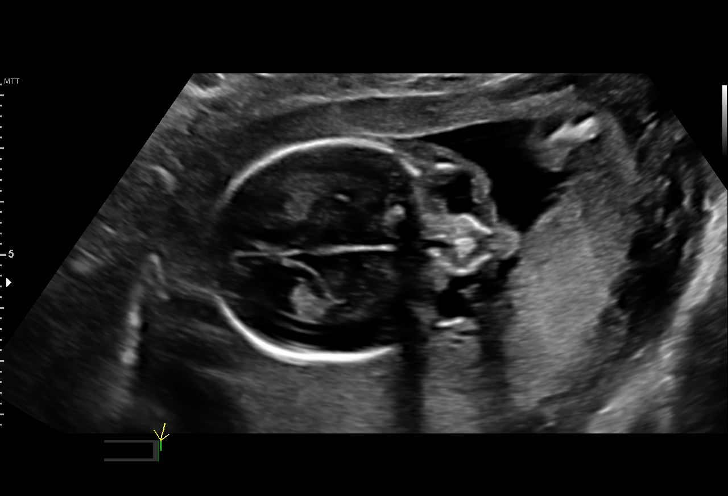
[im 41/101]
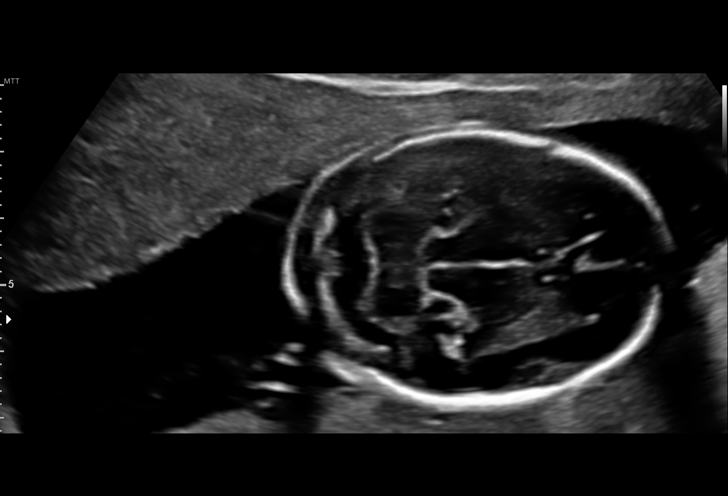
[im 52/101]
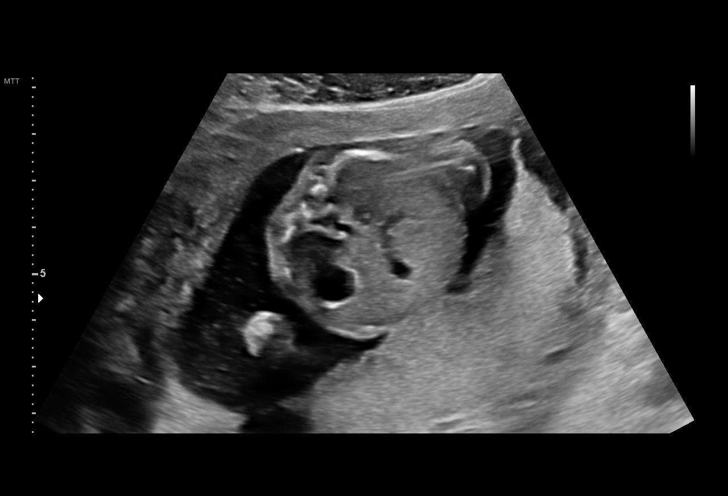
[im 60/101]
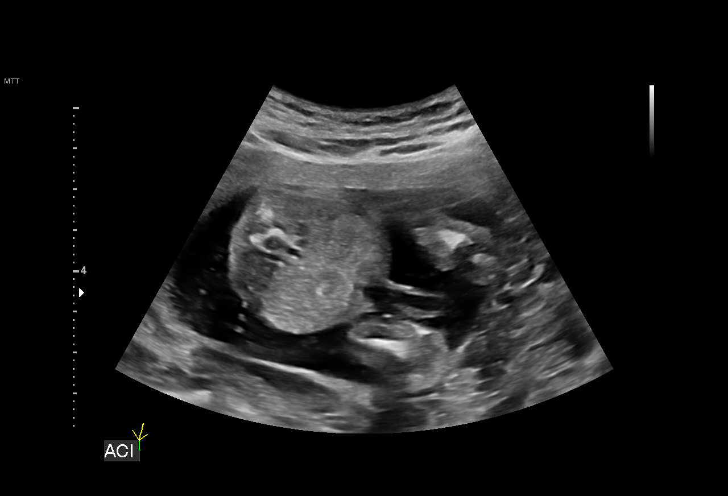
[im 67/101]
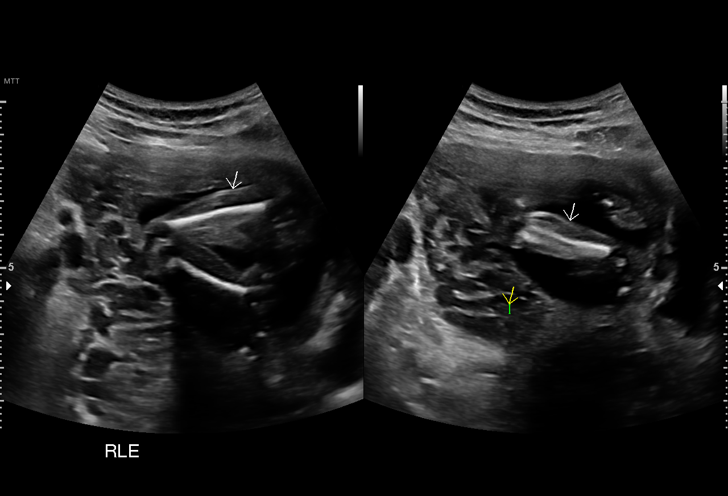
[im 75/101]
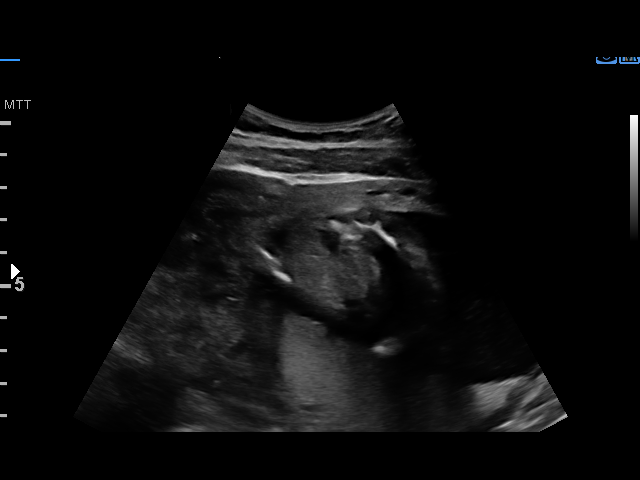
[im 82/101]
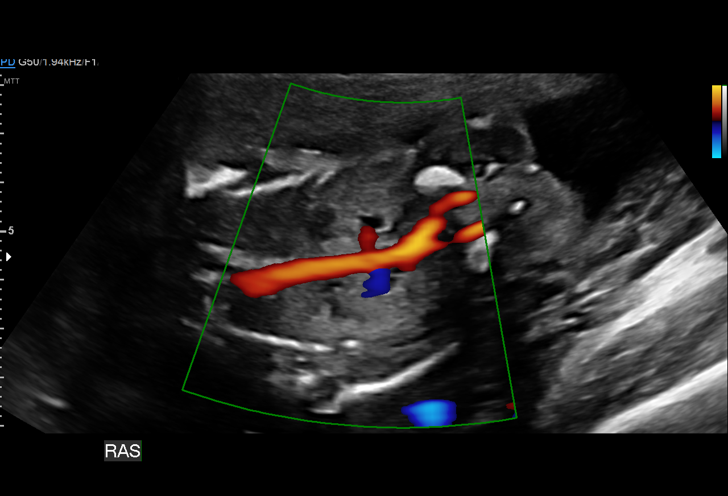
[im 89/101]
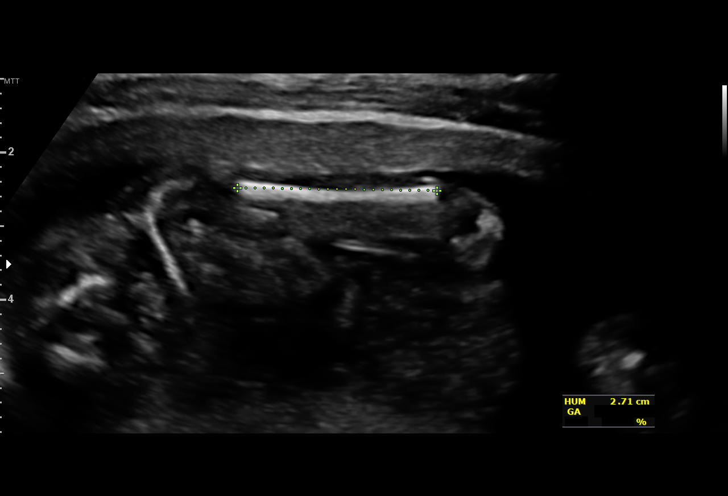
[im 97/101]
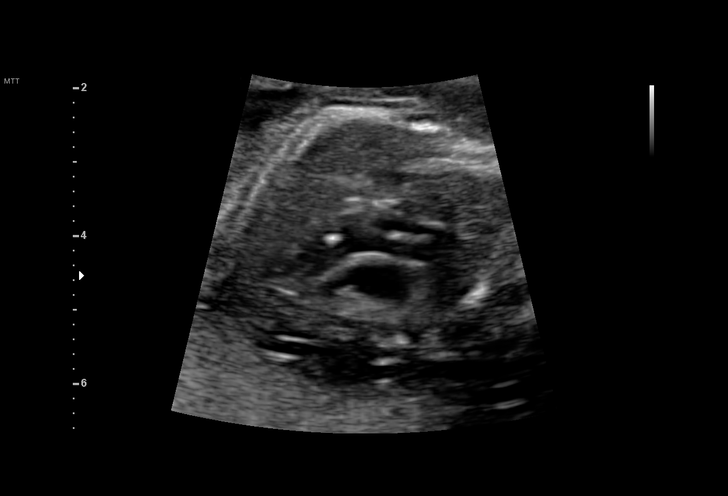

[13 of 28 positions shown; findings below may reference images not displayed]

Performed By:     Maria Fernanda Lue BS,       Secondary Phy.:   [REDACTED] [HOSPITAL]
for [REDACTED]care ([HOSPITAL])

Indications

18 weeks gestation of pregnancy
Encounter for antenatal screening for
malformations
Tobacco use complicating pregnancy,
second trimester
Herpes simplex virus (MARK ARVIN SAGUN)
Late prenatal care, second trimester
OB History

Blood Type:            Height:  5'3"   Weight (lb):  151      BMI:
Gravidity:    2         Term:   1        Prem:   0        SAB:   0
TOP:          0       Ectopic:  0        Living: 1
Fetal Evaluation

Num Of Fetuses:     1
Fetal Heart         143
Rate(bpm):
Cardiac Activity:   Observed
Presentation:       Breech
Placenta:           Posterior, above cervical os
P. Cord Insertion:  Visualized, central
Amniotic Fluid
AFI FV:      Subjectively within normal limits

Largest Pocket(cm)
3.9
Biometry

BPD:      40.2  mm     G. Age:  18w 1d         23  %    CI:         69.9   %   70 - 86
FL/HC:      18.2   %   16.1 -
HC:      153.4  mm     G. Age:  18w 2d         20  %    HC/AC:      1.16       1.09 -
AC:      132.8  mm     G. Age:  18w 5d         44  %    FL/BPD:     69.4   %
FL:       27.9  mm     G. Age:  18w 4d         33  %    FL/AC:      21.0   %   20 - 24
HUM:      26.8  mm     G. Age:  18w 4d         41  %
CER:      19.9  mm     G. Age:  19w 0d         54  %
NFT:       4.2  mm
CM:        4.8  mm

Est. FW:     249  gm      0 lb 9 oz     40  %
Gestational Age

LMP:           18w 6d       Date:   05/17/16                 EDD:   02/21/17
U/S Today:     18w 3d                                        EDD:   02/24/17
Best:          18w 6d    Det. By:   LMP  (05/17/16)          EDD:   02/21/17
Anatomy

Cranium:               Appears normal         Aortic Arch:            Appears normal
Cavum:                 Appears normal         Ductal Arch:            Not well visualized
Ventricles:            Appears normal         Diaphragm:              Appears normal
Choroid Plexus:        Appears normal         Stomach:                Appears normal, left
sided
Cerebellum:            Appears normal         Abdomen:                Appears normal
Posterior Fossa:       Appears normal         Abdominal Wall:         Appears nml (cord
insert, abd wall)
Nuchal Fold:           Appears normal         Cord Vessels:           Appears normal (3
vessel cord)
Face:                  Orbits nl; profile not Kidneys:                Appear normal
well visualized
Lips:                  Appears normal         Bladder:                Appears normal
Thoracic:              Appears normal         Spine:                  Appears normal
Heart:                 Echogenic focus        Upper Extremities:      Appears normal
in LV
RVOT:                  Not well visualized    Lower Extremities:      Appears normal
LVOT:                  Appears normal

Other:  Parents do not wish to know sex of fetus at this time.  Fetus appears
to be a female. Heels visualized. Technically difficult due to fetal
position.
Cervix Uterus Adnexa

Cervix
Length:           3.45  cm.
Normal appearance by transabdominal scan.

Uterus
No abnormality visualized.
Left Ovary
Size(cm)     2.91  x    2.63   x  1.9       Vol(ml):
Within normal limits. No adnexal mass visualized.

Right Ovary
Size(cm)     3.23  x    3.09   x  1.93      Vol(ml):
Within normal limits. No adnexal mass visualized.

Cul De Sac:   No free fluid seen.

Adnexa:       No abnormality visualized.
Comments

An echogenic focus was seen in the left cardiac ventricle.
However, no gross fetal anomalies or other soft markers of
aneuploidy were identified.   An echogenic intracardiac focus
is felt to represent a calcified papillary muscle, and is not
associated with structural or functional cardiac abnormalities.
Although an echogenic cardiac focus may be associated with
an increased risk of Down syndrome, this risk is felt to be
minimal, especially when it is seen as an isolated finding.
Impression

Single IUP at 18w 6d
An echogenic intracardiac focus was noted (see comments)
Limited views of the fetal heart were obtained (RVOT, ductal
arch)
The remainder of the fetal anatomy appears normal
Ultrasound measurements are consistent with LMP
Posterior placenta without previa
Normal amniotic fluid volume
Recommendations

Recommend follow-up ultrasound examination in 4 weeks to
complete anatomy

## 2019-05-07 ENCOUNTER — Telehealth: Payer: No Typology Code available for payment source | Admitting: Family

## 2019-05-07 DIAGNOSIS — R399 Unspecified symptoms and signs involving the genitourinary system: Secondary | ICD-10-CM

## 2019-05-07 DIAGNOSIS — R109 Unspecified abdominal pain: Secondary | ICD-10-CM

## 2019-05-07 NOTE — Progress Notes (Signed)
Based on what you shared with me, I feel your condition warrants further evaluation and I recommend that you be seen for a face to face office visit.  Given that you are having UTI symptoms with back pain, you need to be seen face to face for further testing and have a urine culture to rule out a more serious infection.   NOTE: If you entered your credit card information for this eVisit, you will not be charged. You may see a "hold" on your card for the $35 but that hold will drop off and you will not have a charge processed.  If you are having a true medical emergency please call 911.     For an urgent face to face visit, [facility] has four urgent care centers for your convenience:    NEW:  [facility] [place] [address] [place], [place] [address] .  Monday - Friday 10 am - 6 pm    . [facility]    [phone]                  Get Driving Directions  [id number] [address], [place] [address] . 10 am to 8 pm Monday-Friday . 12 pm to 8 pm Saturday-Sunday   . [facility] at [place]                  Get Driving Directions  [id number] [place], [place], [place] [address] . 8 am to 8 pm Monday-Friday . 9 am to 6 pm Saturday . 11 am to 6 pm Sunday     . [facility] at [place]                  Get Driving Directions   [address] [place], [place] [address] . 8 am to 8 pm Monday-Friday . 8 am to 4 pm Saturday-Sunday    . [facility] at [place]                    Get Driving Directions  [id number]  [address] [place], [place] [address]  . Monday-Friday, 12 PM to 6 PM    Your e-visit answers were reviewed by a board certified advanced clinical practitioner to complete your personal care plan.  Thank you for using e-Visits.

## 2019-11-14 ENCOUNTER — Telehealth: Payer: No Typology Code available for payment source | Admitting: Physician Assistant

## 2019-11-14 DIAGNOSIS — H60509 Unspecified acute noninfective otitis externa, unspecified ear: Secondary | ICD-10-CM | POA: Diagnosis not present

## 2019-11-14 MED ORDER — NEOMYCIN-POLYMYXIN-HC 3.5-10000-1 OT SOLN: 3.0000 [drp] | Freq: Four times a day (QID) | OTIC | 0 refills | Status: AC

## 2019-11-14 NOTE — Progress Notes (Signed)
E Visit for Swimmer's Ear  We are sorry that you are not feeling well. Here is how we plan to help!  Based on what you have shared with me it looks like you have swimmers ear. Swimmer's ear is a redness or swelling, irritation, or infection of your outer ear canal.  These symptoms usually occur within a few days of swimming.  Your ear canal is a tube that goes from the opening of the ear to the eardrum.  When water stays in your ear canal, germs can grow.  This is a painful condition that often happens to children and swimmers of all ages.  It is not contagious and oral antibiotics are not required to treat uncomplicated swimmer's ear.  The usual symptoms include: Itching inside the ear, Redness or a sense of swelling in the ear, Pain when the ear is tugged on when pressure is placed on the ear, Pus draining from the infected ear.   I have prescribed Cortisporin ear drops. Use these as directed.  Your symptoms should improve over the next 3 days and should resolve in about 7 days. Please be seen in person if your symptoms are not improving.    In certain cases swimmer's ear may progress to a more serious bacterial infection of the middle or inner ear.  If you have a fever 102 and up and significantly worsening symptoms, this could indicate a more serious infection moving to the middle/inner and needs face to face evaluation in an office by a provider.   HOME CARE:   Wash your hands frequently.  Do not place the tip of the bottle on your ear or touch it with your fingers.  You can take Acetominophen 650 mg every 4-6 hours as needed for pain.  If pain is severe or moderate, you can apply a heating pad (set on low) or hot water bottle (wrapped in a towel) to outer ear for 20 minutes.  This will also increase drainage.  Avoid ear plugs  Do not use Q-tips  After showers, help the water run out by tilting your head to one side.  GET HELP RIGHT AWAY IF:   Fever is over 102.2 degrees.  You  develop progressive ear pain or hearing loss.  Ear symptoms persist longer than 3 days after treatment.  MAKE SURE YOU:   Understand these instructions.  Will watch your condition.  Will get help right away if you are not doing well or get worse.  TO PREVENT SWIMMER'S EAR:  Use a bathing cap or custom fitted swim molds to keep your ears dry.  Towel off after swimming to dry your ears.  Tilt your head or pull your earlobes to allow the water to escape your ear canal.  If there is still water in your ears, consider using a hairdryer on the lowest setting.  Thank you for choosing an e-visit. Your e-visit answers were reviewed by a board certified advanced clinical practitioner to complete your personal care plan. Depending upon the condition, your plan could have included both over the counter or prescription medications. Please review your pharmacy choice. Be sure that the pharmacy you have chosen is open so that you can pick up your prescription now.  If there is a problem you may message your provider in [place] to have the prescription routed to another pharmacy. Your safety is important to [place]. If you have drug allergies check your prescription carefully.  For the next 24 hours, you can use MyChart to  ask questions about today's visit, request a non-urgent call back, or ask for a work or school excuse from your e-visit provider. You will get an email in the next two days asking about your experience. I hope that your e-visit has been valuable and will speed your recovery.  Greater than 5 minutes, yet less than 10 minutes of time have been spent researching, coordinating and implementing care for this patient today.

## 2019-11-15 ENCOUNTER — Telehealth: Payer: No Typology Code available for payment source | Admitting: Family

## 2019-11-15 DIAGNOSIS — H9203 Otalgia, bilateral: Secondary | ICD-10-CM | POA: Diagnosis not present

## 2019-11-15 NOTE — Progress Notes (Signed)
E Visit for Swimmer's Ear  We are sorry that you are not feeling well. Here is how we plan to help!  Based on what you have shared with me it looks like you have swimmers ear. Swimmer's ear is a redness or swelling, irritation, or infection of your outer ear canal.  These symptoms usually occur within a few days of swimming.  Your ear canal is a tube that goes from the opening of the ear to the eardrum.  When water stays in your ear canal, germs can grow.  This is a painful condition that often happens to children and swimmers of all ages.  It is not contagious and oral antibiotics are not required to treat uncomplicated swimmer's ear.  The usual symptoms include: Itching inside the ear, Redness or a sense of swelling in the ear, Pain when the ear is tugged on when pressure is placed on the ear, Pus draining from the infected ear. and I have prescribed: Neomycin 0.35%, polymyxin B 10,000 units/mL, and hydrocortisone 0,5% otic solution 4 drops in affected ears four times a day for 7 days  In certain cases swimmer's ear may progress to a more serious bacterial infection of the middle or inner ear.  If you have a fever 102 and up and significantly worsening symptoms, this could indicate a more serious infection moving to the middle/inner and needs face to face evaluation in an office by a provider.  Your symptoms should improve over the next 3 days and should resolve in about 7 days.  HOME CARE:   Wash your hands frequently.  Do not place the tip of the bottle on your ear or touch it with your fingers.  You can take Acetominophen 650 mg every 4-6 hours as needed for pain.  If pain is severe or moderate, you can apply a heating pad (set on low) or hot water bottle (wrapped in a towel) to outer ear for 20 minutes.  This will also increase drainage.  Avoid ear plugs  Do not use Q-tips  After showers, help the water run out by tilting your head to one side.  GET HELP RIGHT AWAY IF:   Fever is  over 102.2 degrees.  You develop progressive ear pain or hearing loss.  Ear symptoms persist longer than 3 days after treatment.  MAKE SURE YOU:   Understand these instructions.  Will watch your condition.  Will get help right away if you are not doing well or get worse.  TO PREVENT SWIMMER'S EAR:  Use a bathing cap or custom fitted swim molds to keep your ears dry.  Towel off after swimming to dry your ears.  Tilt your head or pull your earlobes to allow the water to escape your ear canal.  If there is still water in your ears, consider using a hairdryer on the lowest setting.  Thank you for choosing an e-visit. Your e-visit answers were reviewed by a board certified advanced clinical practitioner to complete your personal care plan. Depending upon the condition, your plan could have included both over the counter or prescription medications. Please review your pharmacy choice. Be sure that the pharmacy you have chosen is open so that you can pick up your prescription now.  If there is a problem you may message your provider in [place] to have the prescription routed to another pharmacy. Your safety is important to [place]. If you have drug allergies check your prescription carefully.  For the next 24 hours, you can use MyChart  to ask questions about today's visit, request a non-urgent call back, or ask for a work or school excuse from your e-visit provider. You will get an email in the next two days asking about your experience. I hope that your e-visit has been valuable and will speed your recovery.     Greater than 5 minutes, yet less than 10 minutes of time have been spent researching, coordinating, and implementing care for this patient today.  Thank you for the details you included in the comment boxes. Those details are very helpful in determining the best course of treatment for you and help [place] to provide the best care.

## 2019-11-16 ENCOUNTER — Ambulatory Visit (HOSPITAL_COMMUNITY)
Admission: EM | Admit: 2019-11-16 | Discharge: 2019-11-16 | Disposition: A | Discharge status: Home / Self Care | Payer: No Typology Code available for payment source | Attending: Family Medicine | Admitting: Family Medicine

## 2019-11-16 DIAGNOSIS — H9202 Otalgia, left ear: Secondary | ICD-10-CM

## 2019-11-16 DIAGNOSIS — H60392 Other infective otitis externa, left ear: Secondary | ICD-10-CM

## 2019-11-16 MED ORDER — AMOXICILLIN-POT CLAVULANATE 875-125 MG PO TABS: 1.0000 | ORAL_TABLET | Freq: Two times a day (BID) | ORAL | 0 refills | Status: AC

## 2019-11-16 MED ORDER — IBUPROFEN 800 MG PO TABS: 800.0000 mg | ORAL_TABLET | Freq: Three times a day (TID) | ORAL | 0 refills | Status: DC | PRN

## 2019-11-16 MED ORDER — CIPROFLOXACIN-DEXAMETHASONE 0.3-0.1 % OT SUSP: 4.0000 [drp] | Freq: Two times a day (BID) | OTIC | 0 refills | Status: AC

## 2019-11-16 NOTE — ED Provider Notes (Signed)
[place]   [id number] 11/16/19 Arrival Time: [id number]  CC: EAR PAIN  SUBJECTIVE: History from: patient.  Karen Mcbride is a 29 y.o. female who presents with of left ear pain for the past 3 days.  Denies a precipitating event, such as swimming or wearing ear plugs. Patient states the pain is constant and achy in character. Patient has tried tylenol without relief. Symptoms are made worse with lying down. Denies similar symptoms in the past. Reports that she has had issues with cerumen impactions in the past.    Denies fever, chills, fatigue, sinus pain, rhinorrhea, ear discharge, sore throat, SOB, wheezing, chest pain, nausea, changes in bowel or bladder habits.    ROS: As per HPI.  All other pertinent ROS negative.     Past Medical History:  Diagnosis Date  . Depression   . Genital herpes    Past Surgical History:  Procedure Laterality Date  . TONSILLECTOMY     No Known Allergies No current facility-administered medications on file prior to encounter.   Current Outpatient Medications on File Prior to Encounter  Medication Sig Dispense Refill  . cetirizine-pseudoephedrine (ZYRTEC-D) 5-120 MG tablet Take 1 tablet by mouth daily. 30 tablet 0  . fluticasone (FLONASE) 50 MCG/ACT nasal spray Place 1 spray into both nostrils daily. 16 g 2  . lidocaine (XYLOCAINE) 2 % solution Use as directed 15 mLs in the mouth or throat as needed for mouth pain. 100 mL 0  . neomycin-polymyxin-hydrocortisone (CORTISPORIN) OTIC solution Place 3 drops into the left ear 4 (four) times daily for 7 days. 10 mL 0  . Prenat-FeAsp-Meth-FA-DHA w/o A (PRENATE PIXIE) 10-0.6-0.4-200 MG CAPS Take 1 tablet by mouth daily. 30 capsule 12  . valACYclovir (VALTREX) 500 MG tablet Take 1 tablet (500 mg total) by mouth 2 (two) times daily. 60 tablet 6   Social History   Socioeconomic History  . Marital status: Single    Spouse name: Not on file  . Number of children: Not on file  . Years of education: Not  on file  . Highest education level: Not on file  Occupational History  . Not on file  Tobacco Use  . Smoking status: Current Every Day Smoker    Packs/day: 0.25    Years: 4.00    Pack years: 1.00    Types: Cigarettes    Last attempt to quit: 11/30/2016    Years since quitting: 2.9  . Smokeless tobacco: Never Used  Substance and Sexual Activity  . Alcohol use: Yes    Comment: occasion  . Drug use: No  . Sexual activity: Yes    Birth control/protection: None  Other Topics Concern  . Not on file  Social History Narrative  . Not on file   Social Determinants of Health   Financial Resource Strain:   . Difficulty of Paying Living Expenses:   Food Insecurity:   . Worried About Charity fundraiser in the Last Year:   . Arboriculturist in the Last Year:   Transportation Needs:   . Film/video editor (Medical):   Marland Kitchen Lack of Transportation (Non-Medical):   Physical Activity:   . Days of Exercise per Week:   . Minutes of Exercise per Session:   Stress:   . Feeling of Stress :   Social Connections:   . Frequency of Communication with Friends and Family:   . Frequency of Social Gatherings with Friends and Family:   . Attends Religious Services:   .  Active Member of Clubs or Organizations:   . Attends Archivist Meetings:   Marland Kitchen Marital Status:   Intimate Partner Violence:   . Fear of Current or Ex-Partner:   . Emotionally Abused:   Marland Kitchen Physically Abused:   . Sexually Abused:    Family History  Problem Relation Age of Onset  . Diabetes Maternal Aunt   . Diabetes Maternal Grandfather   . Diverticulosis Mother     OBJECTIVE:  Vitals:   11/16/19 1502  BP: 130/70  Pulse: 92  Resp: 16  Temp: 97.9 F (36.6 C)  TempSrc: Oral  SpO2: 100%     General appearance: alert; appears fatigued HEENT: Ears: L EAC erythematous, tender, R EAC clear, TMs pearly gray with visible cone of light, without erythema; Eyes: PERRL, EOMI grossly; Sinuses nontender to palpation;  Nose: clear rhinorrhea; Throat: oropharynx mildly erythematous, tonsils 1+ without white tonsillar exudates, uvula midline Neck: supple with left sided adenopathy and heat, TTP Lungs: unlabored respirations, symmetrical air entry; cough: absent; no respiratory distress Heart: regular rate and rhythm.  Radial pulses 2+ symmetrical bilaterally Skin: warm and dry Psychological: alert and cooperative; normal mood and affect  Imaging: No results found.   ASSESSMENT & PLAN:  1. Left ear pain   2. Infective otitis externa of left ear     Meds ordered this encounter  Medications  . ciprofloxacin-dexamethasone (CIPRODEX) OTIC suspension    Sig: Place 4 drops into the left ear 2 (two) times daily for 7 days.    Dispense:  7.5 mL    Refill:  0    Order Specific Question:   Supervising Provider    Answer:   Chase Picket [id number]  . amoxicillin-clavulanate (AUGMENTIN) 875-125 MG tablet    Sig: Take 1 tablet by mouth 2 (two) times daily for 10 days.    Dispense:  20 tablet    Refill:  0    Order Specific Question:   Supervising Provider    Answer:   Chase Picket [id number]  . ibuprofen (ADVIL) 800 MG tablet    Sig: Take 1 tablet (800 mg total) by mouth every 8 (eight) hours as needed for moderate pain.    Dispense:  21 tablet    Refill:  0    Order Specific Question:   Supervising Provider    Answer:   Chase Picket [id number]    Rest and drink plenty of fluids Prescribed augmentin 875 for 7 days Prescribed ibuprofen Prescribed ciprofloxacin ear drops Take medications as directed and to completion Continue to use OTC ibuprofen and/ or tylenol as needed for pain control Follow up with PCP if symptoms persists Return here or go to the ER if you have any new or worsening symptoms   Reviewed expectations re: course of current medical issues. Questions answered. Outlined signs and symptoms indicating need for more acute intervention. Patient verbalized  understanding. After Visit Summary given.          Faustino Congress, NP 11/16/19 1524

## 2019-11-16 NOTE — Discharge Instructions (Signed)
You have a Left outer ear infection.   I have sent in oral antibiotics for you to take twice daily for a week  I have also sent in prescription strength ibuprofen.  Also sent in ciprodex drops. Use as directed.

## 2019-11-16 NOTE — ED Triage Notes (Signed)
Pt present left ear pain, symptom started on Friday. Pt is having difficulty chewing and opening her mouth wide because of the left ear pain.

## 2019-11-25 MED FILL — CIPROFLOXACIN-DEXAMETHASONE: 0.3-0.1 | 18 days supply | Qty: 15 | Fill #0

## 2019-11-25 MED FILL — IBUPROFEN 800 MG TABS: 800 | 10 days supply | Qty: 30 | Fill #0

## 2019-12-16 MED FILL — valACYclovir HCL 1 GM TABS: 1 | 15 days supply | Qty: 15 | Fill #0

## 2019-12-16 MED FILL — VALACYCLOVIR HCL 500 MG TAB: 500 | 90 days supply | Qty: 90 | Fill #0

## 2020-02-05 ENCOUNTER — Telehealth: Payer: No Typology Code available for payment source | Admitting: Emergency Medicine

## 2020-02-05 DIAGNOSIS — R197 Diarrhea, unspecified: Secondary | ICD-10-CM

## 2020-02-05 NOTE — Progress Notes (Signed)
We are sorry that you are not feeling well.  Here is how we plan to help!  Based on what you have shared with me it looks like you have Acute Infectious Diarrhea.  Most cases of acute diarrhea are due to infections with virus and bacteria and are self-limited conditions lasting less than 14 days.  For your symptoms you may take Imodium 2 mg tablets that are over the counter at your local pharmacy. Take two tablet now and then one after each loose stool up to 6 a day.  Antibiotics are not needed for most people with diarrhea.   HOME CARE  We recommend changing your diet to help with your symptoms for the next few days.  Drink plenty of fluids that contain water salt and sugar. Sports drinks such as Gatorade may help.   You may try broths, soups, bananas, applesauce, soft breads, mashed potatoes or crackers.   You are considered infectious for as long as the diarrhea continues. Hand washing or use of alcohol based hand sanitizers is recommend.  It is best to stay out of work or school until your symptoms stop.   GET HELP RIGHT AWAY  If you have dark yellow colored urine or do not pass urine frequently you should drink more fluids.    If your symptoms worsen   If you feel like you are going to pass out (faint)  You have a new problem  MAKE SURE YOU   Understand these instructions.  Will watch your condition.  Will get help right away if you are not doing well or get worse.  Your e-visit answers were reviewed by a board certified advanced clinical practitioner to complete your personal care plan.  Depending on the condition, your plan could have included both over the counter or prescription medications.  If there is a problem please reply  once you have received a response from your provider.  Your safety is important to [place].  If you have drug allergies check your prescription carefully.    You can use MyChart to ask questions about today's visit, request a non-urgent call  back, or ask for a work or school excuse for 24 hours related to this e-Visit. If it has been greater than 24 hours you will need to follow up with your provider, or enter a new e-Visit to address those concerns.   You will get an e-mail in the next two days asking about your experience.  I hope that your e-visit has been valuable and will speed your recovery. Thank you for using e-visits.  Approximately 5 minutes was used in reviewing the patient's chart, questionnaire, prescribing medications, and documentation.

## 2020-02-12 ENCOUNTER — Telehealth: Payer: No Typology Code available for payment source | Admitting: Nurse Practitioner

## 2020-02-12 DIAGNOSIS — B373 Candidiasis of vulva and vagina: Secondary | ICD-10-CM | POA: Diagnosis not present

## 2020-02-12 DIAGNOSIS — B3731 Acute candidiasis of vulva and vagina: Secondary | ICD-10-CM

## 2020-02-12 MED ORDER — FLUCONAZOLE 150 MG PO TABS: 150.0000 mg | ORAL_TABLET | Freq: Once | ORAL | 0 refills | Status: AC

## 2020-02-12 NOTE — Progress Notes (Signed)

## 2020-04-16 ENCOUNTER — Other Ambulatory Visit: Payer: Self-pay | Admitting: Family

## 2020-04-16 ENCOUNTER — Telehealth: Payer: No Typology Code available for payment source | Admitting: Emergency Medicine

## 2020-04-16 DIAGNOSIS — J069 Acute upper respiratory infection, unspecified: Secondary | ICD-10-CM | POA: Diagnosis not present

## 2020-04-16 MED ORDER — BENZONATATE 100 MG PO CAPS: 100.0000 mg | ORAL_CAPSULE | Freq: Three times a day (TID) | ORAL | 0 refills | Status: DC | PRN

## 2020-04-16 MED ORDER — DOXYCYCLINE HYCLATE 100 MG PO CAPS: 100.0000 mg | ORAL_CAPSULE | Freq: Two times a day (BID) | ORAL | 0 refills | Status: DC

## 2020-04-16 MED ORDER — PREDNISONE 20 MG PO TABS: 40.0000 mg | ORAL_TABLET | Freq: Every day | ORAL | 0 refills | Status: DC

## 2020-04-16 NOTE — Progress Notes (Signed)
We are sorry you are not feeling well.  Here is how we plan to help!  Based on what you have shared with me, it looks like you may have a viral upper respiratory infection.  Upper respiratory infections are caused by a large number of viruses; however, rhinovirus is the most common cause.   Symptoms vary from person to person, with common symptoms including sore throat, cough, fatigue or lack of energy and feeling of general discomfort.  A low-grade fever of up to 100.4 may present, but is often uncommon.  Symptoms vary however, and are closely related to a person's age or underlying illnesses.  The most common symptoms associated with an upper respiratory infection are nasal discharge or congestion, cough, sneezing, headache and pressure in the ears and face.  These symptoms usually persist for about 3 to 10 days, but can last up to 2 weeks.  It is important to know that upper respiratory infections do not cause serious illness or complications in most cases.    Upper respiratory infections can be transmitted from person to person, with the most common method of transmission being a person's hands.  The virus is able to live on the skin and can infect other persons for up to 2 hours after direct contact.  Also, these can be transmitted when someone coughs or sneezes; thus, it is important to cover the mouth to reduce this risk.  To keep the spread of the illness at [place], good hand hygiene is very important.  This is an infection that is most likely caused by a virus. There are no specific treatments other than to help you with the symptoms until the infection runs its course.  We are sorry you are not feeling well.  Here is how we plan to help!   For nasal congestion, you may use an oral decongestants such as Mucinex D or if you have glaucoma or high blood pressure use plain Mucinex.  Saline nasal spray or nasal drops can help and can safely be used as often as needed for congestion.    If you do not  have a history of heart disease, hypertension, diabetes or thyroid disease, prostate/bladder issues or glaucoma, you may also use Sudafed to treat nasal congestion.  It is highly recommended that you consult with a pharmacist or your primary care physician to ensure this medication is safe for you to take.     If you have a cough, you may use cough suppressants such as Delsym and Robitussin.  If you have glaucoma or high blood pressure, you can also use Coricidin HBP.   For cough I have prescribed for you A prescription cough medication called Tessalon Perles 100 mg. You may take 1-2 capsules every 8 hours as needed for cough  Doxycycline 100 mg 2 x a day for 1 week And prednisone 40 mg daily for 5 days  If you have a sore or scratchy throat, use a saltwater gargle-  to  teaspoon of salt dissolved in a 4-ounce to 8-ounce glass of warm water.  Gargle the solution for approximately 15-30 seconds and then spit.  It is important not to swallow the solution.  You can also use throat lozenges/cough drops and Chloraseptic spray to help with throat pain or discomfort.  Warm or cold liquids can also be helpful in relieving throat pain.  For headache, pain or general discomfort, you can use Ibuprofen or Tylenol as directed.   Some authorities believe that zinc sprays or  the use of Echinacea may shorten the course of your symptoms.   HOME CARE . Only take medications as instructed by your medical team. . Be sure to drink plenty of fluids. Water is fine as well as fruit juices, sodas and electrolyte beverages. You may want to stay away from caffeine or alcohol. If you are nauseated, try taking small sips of liquids. How do you know if you are getting enough fluid? Your urine should be a pale yellow or almost colorless. . Get rest. . Taking a steamy shower or using a humidifier may help nasal congestion and ease sore throat pain. You can place a towel over your head and breathe in the steam from hot water  coming from a faucet. . Using a saline nasal spray works much the same way. . Cough drops, hard candies and sore throat lozenges may ease your cough. . Avoid close contacts especially the very young and the elderly . Cover your mouth if you cough or sneeze . Always remember to wash your hands.   GET HELP RIGHT AWAY IF: . You develop worsening fever. . If your symptoms do not improve within 10 days . You develop yellow or green discharge from your nose over 3 days. . You have coughing fits . You develop a severe head ache or visual changes. . You develop shortness of breath, difficulty breathing or start having chest pain . Your symptoms persist after you have completed your treatment plan  MAKE SURE YOU   Understand these instructions.  Will watch your condition.  Will get help right away if you are not doing well or get worse.  Your e-visit answers were reviewed by a board certified advanced clinical practitioner to complete your personal care plan. Depending upon the condition, your plan could have included both over the counter or prescription medications. Please review your pharmacy choice. If there is a problem, you may call our nursing hot line at and have the prescription routed to another pharmacy. Your safety is important to [place]. If you have drug allergies check your prescription carefully.   You can use MyChart to ask questions about today's visit, request a non-urgent call back, or ask for a work or school excuse for 24 hours related to this e-Visit. If it has been greater than 24 hours you will need to follow up with your provider, or enter a new e-Visit to address those concerns. You will get an e-mail in the next two days asking about your experience.  I hope that your e-visit has been valuable and will speed your recovery. Thank you for using e-visits.     **Please do not respond to this message unless you have follow up questions.** Greater than 5 but less than 10  minutes spent researching, coordinating, and implementing care for this patient today

## 2020-04-16 NOTE — Addendum Note (Signed)
Addended by: Evelina Dun A on: 04/16/2020 07:53 PM   Modules accepted: Orders

## 2020-05-05 ENCOUNTER — Telehealth: Payer: No Typology Code available for payment source | Admitting: Family

## 2020-05-05 DIAGNOSIS — A6000 Herpesviral infection of urogenital system, unspecified: Secondary | ICD-10-CM

## 2020-05-05 NOTE — Progress Notes (Signed)
Based on what you shared with me, I feel your condition warrants further evaluation and I recommend that you be seen for a face to face office visit.  Hello, I do not see a herpes diagnoses in your chart. We can not treat through an Evisit, if this is your first outbreak.    NOTE: If you entered your credit card information for this eVisit, you will not be charged. You may see a "hold" on your card for the $35 but that hold will drop off and you will not have a charge processed.   If you are having a true medical emergency please call 911.      For an urgent face to face visit, [place] has five urgent care centers for your convenience:     [facility] [place] Get Driving Directions [phone] [address] [place], [place] [address] . 10 am - 6pm Monday - Friday    [facility]) Get Driving Directions [phone] [address] [place], [place] [address] . 10 am to 8 pm Monday-Friday . 12 pm to 8 pm [facility] Get Driving Directions [phone] [place], [place], [place] [address] . 8 am to 8 pm Monday-Friday . 9 am to 6 pm Saturday . 11 am to 6 pm Sunday     [facility] Get Driving Directions  [phone] [address] [place], [place] [address] . 8 am to 8 pm Monday-Friday . 8 am to 4 pm [facility] [place] Get Driving Directions [phone] [address] [place], [place] [address] . 12 pm to 6 pm Monday-Friday      Your e-visit answers were reviewed by a board certified advanced clinical practitioner to complete your personal care plan.  Thank you for using e-Visits.

## 2020-05-29 ENCOUNTER — Telehealth: Payer: No Typology Code available for payment source | Admitting: Nurse Practitioner

## 2020-05-29 DIAGNOSIS — J029 Acute pharyngitis, unspecified: Secondary | ICD-10-CM

## 2020-05-29 MED ORDER — AZITHROMYCIN 250 MG PO TABS: ORAL_TABLET | ORAL | 0 refills | Status: DC

## 2020-05-29 NOTE — Progress Notes (Signed)
We are sorry that you are not feeling well.  Here is how we plan to help!  Based on what you have shared with me it is likely that you have strep pharyngitis.  Strep pharyngitis is inflammation and infection in the back of the throat.  This is an infection cause by bacteria and is treated with antibiotics.  I have prescribed Azithromycin 250 mg two tablets today and then one daily for 4 additional days. For throat pain, we recommend over the counter oral pain relief medications such as acetaminophen or aspirin, or anti-inflammatory medications such as ibuprofen or naproxen sodium. Topical treatments such as oral throat lozenges or sprays may be used as needed. Strep infections are not as easily transmitted as other respiratory infections, however we still recommend that you avoid close contact with loved ones, especially the very young and elderly.  Remember to wash your hands thoroughly throughout the day as this is the number one way to prevent the spread of infection and wipe down door knobs and counters with disinfectant.   Home Care:  Only take medications as instructed by your medical team.  Complete the entire course of an antibiotic.  Do not take these medications with alcohol.  A steam or ultrasonic humidifier can help congestion.  You can place a towel over your head and breathe in the steam from hot water coming from a faucet.  Avoid close contacts especially the very young and the elderly.  Cover your mouth when you cough or sneeze.  Always remember to wash your hands.  Get Help Right Away If:  You develop worsening fever or sinus pain.  You develop a severe head ache or visual changes.  Your symptoms persist after you have completed your treatment plan.  Make sure you  Understand these instructions.  Will watch your condition.  Will get help right away if you are not doing well or get worse.  Your e-visit answers were reviewed by a board certified advanced clinical  practitioner to complete your personal care plan.  Depending on the condition, your plan could have included both over the counter or prescription medications.  If there is a problem please reply  once you have received a response from your provider.  Your safety is important to [place].  If you have drug allergies check your prescription carefully.    You can use MyChart to ask questions about today's visit, request a non-urgent call back, or ask for a work or school excuse for 24 hours related to this e-Visit. If it has been greater than 24 hours you will need to follow up with your provider, or enter a new e-Visit to address those concerns.  You will get an e-mail in the next two days asking about your experience.  I hope that your e-visit has been valuable and will speed your recovery. Thank you for using e-visits.   I have spent at least 5 minutes reviewing and documenting in the patient's chart.

## 2020-07-23 ENCOUNTER — Telehealth: Payer: No Typology Code available for payment source | Admitting: Nurse Practitioner

## 2020-07-23 DIAGNOSIS — L249 Irritant contact dermatitis, unspecified cause: Secondary | ICD-10-CM | POA: Diagnosis not present

## 2020-07-23 MED ORDER — PREDNISONE 20 MG PO TABS: 40.0000 mg | ORAL_TABLET | Freq: Every day | ORAL | 0 refills | Status: AC

## 2020-07-23 NOTE — Progress Notes (Signed)
E Visit for Rash  We are sorry that you are not feeling well. Here is how we plan to help!  Based on what you shared with me it looks like you have contact dermatitis.  Contact dermatitis is a skin rash caused by something that touches the skin and causes irritation or inflammation.  Your skin may be red, swollen, dry, cracked, and itch.  The rash should go away in a few days but can last a few weeks.  If you get a rash, it's important to figure out what caused it so the irritant can be avoided in the future.   I have called in a steroid and you can use OTC topical steroids as well Meds ordered this encounter  Medications   predniSONE (DELTASONE) 20 MG tablet    Sig: Take 2 tablets (40 mg total) by mouth daily with breakfast for 5 days. 2 po daily for 5 days    Dispense:  10 tablet    Refill:  0    Order Specific Question:   Supervising Provider    Answer:   Noemi Chapel [[id number]]    I recommend you take Benadryl 25 mg - 50 mg every 4 hours to control the symptoms but if they last over 24 hours it is best that you see an office based provider for follow up.   * if this does not improve by Monday you will need a face  To face visit. Very difficult to tell in picture exactly what is going on.    HOME CARE:   Take cool showers and avoid direct sunlight.  Apply cool compress or wet dressings.  Take a bath in an oatmeal bath.  Sprinkle content of one Aveeno packet under running faucet with comfortably warm water.  Bathe for 15-20 minutes, 1-2 times daily.  Pat dry with a towel. Do not rub the rash.  Use hydrocortisone cream.  Take an antihistamine like Benadryl for widespread rashes that itch.  The adult dose of Benadryl is 25-50 mg by mouth 4 times daily.  Caution:  This type of medication may cause sleepiness.  Do not drink alcohol, drive, or operate dangerous machinery while taking antihistamines.  Do not take these medications if you have prostate enlargement.  Read package  instructions thoroughly on all medications that you take.  GET HELP RIGHT AWAY IF:   Symptoms don't go away after treatment.  Severe itching that persists.  If you rash spreads or swells.  If you rash begins to smell.  If it blisters and opens or develops a yellow-brown crust.  You develop a fever.  You have a sore throat.  You become short of breath.  MAKE SURE YOU:  Understand these instructions. Will watch your condition. Will get help right away if you are not doing well or get worse.  Thank you for choosing an e-visit. Your e-visit answers were reviewed by a board certified advanced clinical practitioner to complete your personal care plan. Depending upon the condition, your plan could have included both over the counter or prescription medications. Please review your pharmacy choice. Be sure that the pharmacy you have chosen is open so that you can pick up your prescription now.  If there is a problem you may message your provider in [place] to have the prescription routed to another pharmacy. Your safety is important to [place]. If you have drug allergies check your prescription carefully.  For the next 24 hours, you can use MyChart to ask questions about  todays visit, request a non-urgent call back, or ask for a work or school excuse from your e-visit provider. You will get an email in the next two days asking about your experience. I hope that your e-visit has been valuable and will speed your recovery.   5-10 minutes spent reviewing and documenting in chart.

## 2020-07-30 ENCOUNTER — Telehealth: Payer: No Typology Code available for payment source | Admitting: Nurse Practitioner

## 2020-07-30 DIAGNOSIS — K1379 Other lesions of oral mucosa: Secondary | ICD-10-CM | POA: Diagnosis not present

## 2020-07-30 MED ORDER — IBUPROFEN 600 MG PO TABS: 600.0000 mg | ORAL_TABLET | Freq: Three times a day (TID) | ORAL | 0 refills | Status: DC | PRN

## 2020-07-30 NOTE — Progress Notes (Signed)
I attempted to call you but were unable to reach you. I am not real sure what you mean by trauma. Based on what you share this is all I can do at this time.  E-Visit for Dental Pain  We are sorry that you are not feeling well.  Here is how we plan to help!  Based on what you have shared with me in the questionnaire, it sounds like you have dental pain/oral pain  Ibuprofen 600mg  3 times a day for 7 days for discomfort  It is imperative that you see a dentist within 10 days of this eVisit to determine the cause of the dental pain and be sure it is adequately treated  A toothache or tooth pain is caused when the nerve in the root of a tooth or surrounding a tooth is irritated. Dental (tooth) infection, decay, injury, or loss of a tooth are the most common causes of dental pain. Pain may also occur after an extraction (tooth is pulled out). Pain sometimes originates from other areas and radiates to the jaw, thus appearing to be tooth pain.Bacteria growing inside your mouth can contribute to gum disease and dental decay, both of which can cause pain. A toothache occurs from inflammation of the central portion of the tooth called pulp. The pulp contains nerve endings that are very sensitive to pain. Inflammation to the pulp or pulpitis may be caused by dental cavities, trauma, and infection.    HOME CARE:   For toothaches: . Over-the-counter pain medications such as acetaminophen or ibuprofen may be used. Take these as directed on the package while you arrange for a dental appointment. . Avoid very cold or hot foods, because they may make the pain worse. . You may get relief from biting on a cotton ball soaked in oil of cloves. You can get oil of cloves at most drug stores.  For jaw pain: .  Aspirin may be helpful for problems in the joint of the jaw in adults. . If pain happens every time you open your mouth widely, the temporomandibular joint (TMJ) may be the source of the pain. Yawning or taking  a large bite of food may worsen the pain. An appointment with your doctor or dentist will help you find the cause.     GET HELP RIGHT AWAY IF:  . You have a high fever or chills . If you have had a recent head or face injury and develop headache, light headedness, nausea, vomiting, or other symptoms that concern you after an injury to your face or mouth, you could have a more serious injury in addition to your dental injury. . A facial rash associated with a toothache: This condition may improve with medication. Contact your doctor for them to decide what is appropriate. . Any jaw pain occurring with chest pain: Although jaw pain is most commonly caused by dental disease, it is sometimes referred pain from other areas. People with heart disease, especially people who have had stents placed, people with diabetes, or those who have had heart surgery may have jaw pain as a symptom of heart attack or angina. If your jaw or tooth pain is associated with lightheadedness, sweating, or shortness of breath, you should see a doctor as soon as possible. . Trouble swallowing or excessive pain or bleeding from gums: If you have a history of a weakened immune system, diabetes, or steroid use, you may be more susceptible to infections. Infections can often be more severe and extensive or  caused by unusual organisms. Dental and gum infections in people with these conditions may require more aggressive treatment. An abscess may need draining or IV antibiotics, for example.  MAKE SURE YOU    Understand these instructions.  Will watch your condition.  Will get help right away if you are not doing well or get worse.  Thank you for choosing an e-visit. Your e-visit answers were reviewed by a board certified advanced clinical practitioner to complete your personal care plan. Depending upon the condition, your plan could have included both over the counter or prescription medications. Please review your pharmacy  choice. Make sure the pharmacy is open so you can pick up prescription now. If there is a problem, you may contact your provider through [employer] and have the prescription routed to another pharmacy. Your safety is important to [place]. If you have drug allergies check your prescription carefully.  For the next 24 hours you can use MyChart to ask questions about today's visit, request a non-urgent call back, or ask for a work or school excuse. You will get an email in the next two days asking about your experience. I hope that your e-visit has been valuable and will speed your recovery.  5-10 minutes spent reviewing and documenting in chart.

## 2020-09-06 ENCOUNTER — Telehealth: Payer: No Typology Code available for payment source | Admitting: Physician Assistant

## 2020-09-06 DIAGNOSIS — R300 Dysuria: Secondary | ICD-10-CM

## 2020-09-06 DIAGNOSIS — N898 Other specified noninflammatory disorders of vagina: Secondary | ICD-10-CM

## 2020-09-06 DIAGNOSIS — N949 Unspecified condition associated with female genital organs and menstrual cycle: Secondary | ICD-10-CM

## 2020-09-06 NOTE — Progress Notes (Signed)
Based on what you shared with me, I feel your condition warrants further evaluation and I recommend that you be seen for a face to face office visit. As discussed before giving genital lesions, etc you need to be seen. If your primary care is unable to see you, please look into one of the options below for evaluation.    NOTE: If you entered your credit card information for this eVisit, you will not be charged. You may see a "hold" on your card for the $35 but that hold will drop off and you will not have a charge processed.   If you are having a true medical emergency please call 911.      For an urgent face to face visit, [place] has five urgent care centers for your convenience:     [facility] [place] [facility] [place] Get Driving Directions [phone] [place], [place] [address] . 10 am - 6pm Monday - Friday    [place] [facility]) Get Driving Directions [phone] [address] [place], [place] [address] . 10 am to 8 pm Monday-Friday . 12 pm to 8 pm [facility] [place] Get Driving Directions [phone] [place], [place], [place] [address] . 8 am to 8 pm Monday-Friday . 9 am to 6 pm Saturday . 11 am to 6 pm Sunday     [facility] [place] Get Driving Directions  [phone] [address] [place], [place] [address] . 8 am to 8 pm Monday-Friday . 8 am to 4 pm [facility] at [place] Get Driving Directions [phone] [address] [place], [place] [address] . 12 pm to 6 pm Monday-Friday      Your e-visit answers were reviewed by a board certified advanced clinical practitioner to complete your personal care plan.  Thank you for using e-Visits.

## 2020-09-06 NOTE — Progress Notes (Signed)
Based on what you shared with me, I feel your condition warrants further evaluation and I recommend that you be seen for a face to face office visit. Giving vaginal discharge in combination with urinary symptoms of urgency, frequency, pain and sores on the genitals, you need to be seen in person for examination and urine and/or vaginal testing to make sure you get the most appropriate treatment in case there is more than one thing causing your symptoms.    NOTE: If you entered your credit card information for this eVisit, you will not be charged. You may see a "hold" on your card for the $35 but that hold will drop off and you will not have a charge processed.   If you are having a true medical emergency please call 911.      For an urgent face to face visit, [place] has five urgent care centers for your convenience:     [place] [facility] [place] Get Driving Directions [phone] [place], [place] [address] . 10 am - 6pm Monday - Friday    [place] [facility]) Get Driving Directions [phone] [address] [place], [place] [address] . 10 am to 8 pm Monday-Friday . 12 pm to 8 pm [facility]  Get Driving Directions [phone] [place], [place], [place] [address] . 8 am to 8 pm Monday-Friday . 9 am to 6 pm Saturday . 11 am to 6 pm Sunday     [facility] [place] Get Driving Directions  [phone] [address] [place],  [address] . 8 am to 8 pm Monday-Friday . 8 am to 4 pm [facility] at [place] Get Driving Directions [phone] [place] [place],  [address] . 12 pm to 6 pm Monday-Friday      Your e-visit answers were reviewed by a board certified advanced clinical practitioner to complete your personal care plan.  Thank you  for using e-Visits.

## 2020-09-21 ENCOUNTER — Telehealth: Payer: No Typology Code available for payment source | Admitting: Physician Assistant

## 2020-09-21 DIAGNOSIS — J069 Acute upper respiratory infection, unspecified: Secondary | ICD-10-CM | POA: Diagnosis not present

## 2020-09-21 MED ORDER — BENZONATATE 100 MG PO CAPS: 100.0000 mg | ORAL_CAPSULE | Freq: Three times a day (TID) | ORAL | 0 refills | Status: DC | PRN

## 2020-09-21 NOTE — Progress Notes (Signed)
We are sorry that you are not feeling well.  Here is how we plan to help!  Based on your presentation I believe you most likely have A cough due to a virus.  This is called viral bronchitis and is best treated by rest, plenty of fluids and control of the cough.  You may use Ibuprofen or Tylenol as directed to help your symptoms.     In addition you may use A prescription cough medication called Tessalon Perles 100mg . You may take 1-2 capsules every 8 hours as needed for your cough. The prescription has been sent to your pharmacy.  Giving current symptoms and low-grade fever, it is also recommended you consider getting COVID tested to be cautious.    From your responses in the eVisit questionnaire you describe inflammation in the upper respiratory tract which is causing a significant cough.  This is commonly called Bronchitis and has four common causes:    Allergies  Viral Infections  Acid Reflux  Bacterial Infection Allergies, viruses and acid reflux are treated by controlling symptoms or eliminating the cause. An example might be a cough caused by taking certain blood pressure medications. You stop the cough by changing the medication. Another example might be a cough caused by acid reflux. Controlling the reflux helps control the cough.  USE OF BRONCHODILATOR ("RESCUE") INHALERS: There is a risk from using your bronchodilator too frequently.  The risk is that over-reliance on a medication which only relaxes the muscles surrounding the breathing tubes can reduce the effectiveness of medications prescribed to reduce swelling and congestion of the tubes themselves.  Although you feel brief relief from the bronchodilator inhaler, your asthma may actually be worsening with the tubes becoming more swollen and filled with mucus.  This can delay other crucial treatments, such as oral steroid medications. If you need to use a bronchodilator inhaler daily, several times per day, you should discuss this  with your provider.  There are probably better treatments that could be used to keep your asthma under control.     HOME CARE . Only take medications as instructed by your medical team. . Complete the entire course of an antibiotic. . Drink plenty of fluids and get plenty of rest. . Avoid close contacts especially the very young and the elderly . Cover your mouth if you cough or cough into your sleeve. . Always remember to wash your hands . A steam or ultrasonic humidifier can help congestion.   GET HELP RIGHT AWAY IF: . You develop worsening fever. . You become short of breath . You cough up blood. . Your symptoms persist after you have completed your treatment plan MAKE SURE YOU   Understand these instructions.  Will watch your condition.  Will get help right away if you are not doing well or get worse.  Your e-visit answers were reviewed by a board certified advanced clinical practitioner to complete your personal care plan.  Depending on the condition, your plan could have included both over the counter or prescription medications. If there is a problem please reply  once you have received a response from your provider. Your safety is important to [place].  If you have drug allergies check your prescription carefully.    You can use MyChart to ask questions about today's visit, request a non-urgent call back, or ask for a work or school excuse for 24 hours related to this e-Visit. If it has been greater than 24 hours you will need to follow up  with your provider, or enter a new e-Visit to address those concerns. You will get an e-mail in the next two days asking about your experience.  I hope that your e-visit has been valuable and will speed your recovery. Thank you for using e-visits.

## 2020-09-21 NOTE — Progress Notes (Signed)
I have spent 5 minutes in review of e-visit questionnaire, review and updating patient chart, medical decision making and response to patient.   Smith Mcnicholas Cody Hoover Grewe, PA-C    

## 2020-10-08 ENCOUNTER — Telehealth: Payer: No Typology Code available for payment source | Admitting: Physician Assistant

## 2020-10-08 DIAGNOSIS — J029 Acute pharyngitis, unspecified: Secondary | ICD-10-CM | POA: Diagnosis not present

## 2020-10-08 NOTE — Progress Notes (Signed)
We are sorry that you are not feeling well.  Here is how we plan to help!  Your symptoms indicate a likely viral infection (Pharyngitis).   Pharyngitis is inflammation in the back of the throat which can cause a sore throat, scratchiness and sometimes difficulty swallowing.   Pharyngitis is typically caused by a respiratory virus and will just run its course.  Please keep in mind that your symptoms could last up to 10 days.  For throat pain, we recommend over the counter oral pain relief medications such as acetaminophen or aspirin, or anti-inflammatory medications such as ibuprofen or naproxen sodium.  Topical treatments such as oral throat lozenges or sprays may be used as needed.  Avoid close contact with loved ones, especially the very young and elderly.  Remember to wash your hands thoroughly throughout the day as this is the number one way to prevent the spread of infection and wipe down door knobs and counters with disinfectant.  After careful review of your answers, I would not recommend and antibiotic for your condition.  Antibiotics should not be used to treat conditions that we suspect are caused by viruses like the virus that causes the common cold or flu. However, some people can have Strep with atypical symptoms. You may need formal testing in clinic or office to confirm if your symptoms continue or worsen.  Providers prescribe antibiotics to treat infections caused by bacteria. Antibiotics are very powerful in treating bacterial infections when they are used properly.  To maintain their effectiveness, they should be used only when necessary.  Overuse of antibiotics has resulted in the development of super bugs that are resistant to treatment!    Home Care:  Only take medications as instructed by your medical team.  Do not drink alcohol while taking these medications.  A steam or ultrasonic humidifier can help congestion.  You can place a towel over your head and breathe in the steam from  hot water coming from a faucet.  Avoid close contacts especially the very young and the elderly.  Cover your mouth when you cough or sneeze.  Always remember to wash your hands.  Get Help Right Away If:  You develop worsening fever or throat pain.  You develop a severe head ache or visual changes.  Your symptoms persist after you have completed your treatment plan.  Make sure you  Understand these instructions.  Will watch your condition.  Will get help right away if you are not doing well or get worse.  Your e-visit answers were reviewed by a board certified advanced clinical practitioner to complete your personal care plan.  Depending on the condition, your plan could have included both over the counter or prescription medications.  If there is a problem please reply  once you have received a response from your provider.  Your safety is important to [place].  If you have drug allergies check your prescription carefully.    You can use MyChart to ask questions about todays visit, request a non-urgent call back, or ask for a work or school excuse for 24 hours related to this e-Visit. If it has been greater than 24 hours you will need to follow up with your provider, or enter a new e-Visit to address those concerns.  You will get an e-mail in the next two days asking about your experience.  I hope that your e-visit has been valuable and will speed your recovery. Thank you for using e-visits.

## 2020-10-08 NOTE — Progress Notes (Signed)
I have spent 5 minutes in review of e-visit questionnaire, review and updating patient chart, medical decision making and response to patient.   Tishanna Dunford Cody Ayad Nieman, PA-C    

## 2020-10-24 ENCOUNTER — Telehealth: Payer: No Typology Code available for payment source | Admitting: Family

## 2020-10-24 DIAGNOSIS — J029 Acute pharyngitis, unspecified: Secondary | ICD-10-CM

## 2020-10-24 MED ORDER — AMOXICILLIN 500 MG PO CAPS: 500.0000 mg | ORAL_CAPSULE | Freq: Two times a day (BID) | ORAL | 0 refills | Status: AC

## 2020-10-24 NOTE — Progress Notes (Signed)
We are sorry that you are not feeling well.  Here is how we plan to help!  Based on what you have shared with me it is likely that you have strep pharyngitis.  Strep pharyngitis is inflammation and infection in the back of the throat.  This is an infection cause by bacteria and is treated with antibiotics.  I have prescribed Amoxicillin 500 mg twice a day for 10 days. For throat pain, we recommend over the counter oral pain relief medications such as acetaminophen or aspirin, or anti-inflammatory medications such as ibuprofen or naproxen sodium. Topical treatments such as oral throat lozenges or sprays may be used as needed. Strep infections are not as easily transmitted as other respiratory infections, however we still recommend that you avoid close contact with loved ones, especially the very young and elderly.  Remember to wash your hands thoroughly throughout the day as this is the number one way to prevent the spread of infection and wipe down door knobs and counters with disinfectant.  If you do not feel better, you need to follow up with your PCP.   Home Care:  Only take medications as instructed by your medical team.  Complete the entire course of an antibiotic.  Do not take these medications with alcohol.  A steam or ultrasonic humidifier can help congestion.  You can place a towel over your head and breathe in the steam from hot water coming from a faucet.  Avoid close contacts especially the very young and the elderly.  Cover your mouth when you cough or sneeze.  Always remember to wash your hands.  Get Help Right Away If:  You develop worsening fever or sinus pain.  You develop a severe head ache or visual changes.  Your symptoms persist after you have completed your treatment plan.  Make sure you  Understand these instructions.  Will watch your condition.  Will get help right away if you are not doing well or get worse.  Your e-visit answers were reviewed by a board  certified advanced clinical practitioner to complete your personal care plan.  Depending on the condition, your plan could have included both over the counter or prescription medications.  If there is a problem please reply  once you have received a response from your provider.  Your safety is important to [place].  If you have drug allergies check your prescription carefully.    You can use MyChart to ask questions about today's visit, request a non-urgent call back, or ask for a work or school excuse for 24 hours related to this e-Visit. If it has been greater than 24 hours you will need to follow up with your provider, or enter a new e-Visit to address those concerns.  You will get an e-mail in the next two days asking about your experience.  I hope that your e-visit has been valuable and will speed your recovery. Thank you for using e-visits.   Approximately 5 minutes was spent documenting and reviewing patient's chart.

## 2020-12-28 ENCOUNTER — Other Ambulatory Visit (HOSPITAL_COMMUNITY): Payer: Self-pay

## 2020-12-28 ENCOUNTER — Other Ambulatory Visit: Payer: Self-pay

## 2021-01-05 ENCOUNTER — Other Ambulatory Visit (HOSPITAL_COMMUNITY): Payer: Self-pay

## 2021-01-05 ENCOUNTER — Other Ambulatory Visit: Payer: Self-pay

## 2021-01-06 ENCOUNTER — Other Ambulatory Visit (HOSPITAL_COMMUNITY): Payer: Self-pay

## 2021-01-06 MED ORDER — VALACYCLOVIR HCL 1 G PO TABS
1000.0000 mg | ORAL_TABLET | Freq: Every day | ORAL | 0 refills | Status: DC
  Filled 2021-01-06: qty 15, 15d supply, fill #0

## 2021-02-04 ENCOUNTER — Telehealth: Payer: No Typology Code available for payment source | Admitting: Nurse Practitioner

## 2021-02-04 DIAGNOSIS — K047 Periapical abscess without sinus: Secondary | ICD-10-CM | POA: Diagnosis not present

## 2021-02-05 MED ORDER — AMOXICILLIN 500 MG PO CAPS: 500.0000 mg | ORAL_CAPSULE | Freq: Three times a day (TID) | ORAL | 0 refills | Status: AC

## 2021-02-05 MED ORDER — IBUPROFEN 600 MG PO TABS: 600.0000 mg | ORAL_TABLET | Freq: Three times a day (TID) | ORAL | 0 refills | Status: DC | PRN

## 2021-02-05 NOTE — Progress Notes (Signed)
E-Visit for Dental Pain  We are sorry that you are not feeling well.  Here is how we plan to help!  Based on what you have shared with me in the questionnaire, it sounds like you have an infection related to your cavity. We are going to start you on an antibiotic, you can also use prescription strength ibuprofen for pain relief.  Please note that ibuprofen is a NSAID and should not be used in combination with other NSAIDs (aspirin, aleve, motrin, advil, naproxen)  We will send the following two prescriptions to your pharmacy:  Ibuprofen '600mg'$  3 times a day for 7 days for discomfort and Amoxicillin '500mg'$  3 times per day for 10 days  It is imperative that you see a dentist within 10 days of this eVisit to determine the cause of the dental pain and be sure it is adequately treated  A toothache or tooth pain is caused when the nerve in the root of a tooth or surrounding a tooth is irritated. Dental (tooth) infection, decay, injury, or loss of a tooth are the most common causes of dental pain. Pain may also occur after an extraction (tooth is pulled out). Pain sometimes originates from other areas and radiates to the jaw, thus appearing to be tooth pain.Bacteria growing inside your mouth can contribute to gum disease and dental decay, both of which can cause pain. A toothache occurs from inflammation of the central portion of the tooth called pulp. The pulp contains nerve endings that are very sensitive to pain. Inflammation to the pulp or pulpitis may be caused by dental cavities, trauma, and infection.    HOME CARE:   For toothaches: Over-the-counter pain medications such as acetaminophen or ibuprofen may be used. Take these as directed on the package while you arrange for a dental appointment. Avoid very cold or hot foods, because they may make the pain worse. You may get relief from biting on a cotton ball soaked in oil of cloves. You can get oil of cloves at most drug stores.  For jaw pain:   Aspirin may be helpful for problems in the joint of the jaw in adults. If pain happens every time you open your mouth widely, the temporomandibular joint (TMJ) may be the source of the pain. Yawning or taking a large bite of food may worsen the pain. An appointment with your doctor or dentist will help you find the cause.     GET HELP RIGHT AWAY IF:  You have a high fever or chills If you have had a recent head or face injury and develop headache, light headedness, nausea, vomiting, or other symptoms that concern you after an injury to your face or mouth, you could have a more serious injury in addition to your dental injury. A facial rash associated with a toothache: This condition may improve with medication. Contact your doctor for them to decide what is appropriate. Any jaw pain occurring with chest pain: Although jaw pain is most commonly caused by dental disease, it is sometimes referred pain from other areas. People with heart disease, especially people who have had stents placed, people with diabetes, or those who have had heart surgery may have jaw pain as a symptom of heart attack or angina. If your jaw or tooth pain is associated with lightheadedness, sweating, or shortness of breath, you should see a doctor as soon as possible. Trouble swallowing or excessive pain or bleeding from gums: If you have a history of a weakened immune system,  diabetes, or steroid use, you may be more susceptible to infections. Infections can often be more severe and extensive or caused by unusual organisms. Dental and gum infections in people with these conditions may require more aggressive treatment. An abscess may need draining or IV antibiotics, for example.  MAKE SURE YOU   Understand these instructions. Will watch your condition. Will get help right away if you are not doing well or get worse.  Thank you for choosing an e-visit.  Your e-visit answers were reviewed by a board certified advanced  clinical practitioner to complete your personal care plan. Depending upon the condition, your plan could have included both over the counter or prescription medications.  Please review your pharmacy choice. Make sure the pharmacy is open so you can pick up prescription now. If there is a problem, you may contact your provider through [employer] and have the prescription routed to another pharmacy.  Your safety is important to [place]. If you have drug allergies check your prescription carefully.   For the next 24 hours you can use MyChart to ask questions about today's visit, request a non-urgent call back, or ask for a work or school excuse. You will get an email in the next two days asking about your experience. I hope that your e-visit has been valuable and will speed your recovery.   I spent approximately 7 minutes reviewing the patient's history, current symptoms and coordinating their plan of care today.    Meds ordered this encounter  Medications   amoxicillin (AMOXIL) 500 MG capsule    Sig: Take 1 capsule (500 mg total) by mouth 3 (three) times daily for 10 days.    Dispense:  30 capsule    Refill:  0   ibuprofen (ADVIL) 600 MG tablet    Sig: Take 1 tablet (600 mg total) by mouth every 8 (eight) hours as needed.    Dispense:  30 tablet    Refill:  0

## 2021-03-02 ENCOUNTER — Telehealth: Payer: No Typology Code available for payment source | Admitting: Nurse Practitioner

## 2021-03-02 DIAGNOSIS — R059 Cough, unspecified: Secondary | ICD-10-CM | POA: Diagnosis not present

## 2021-03-02 MED ORDER — AZITHROMYCIN 250 MG PO TABS: ORAL_TABLET | ORAL | 0 refills | Status: DC

## 2021-03-02 MED ORDER — BENZONATATE 100 MG PO CAPS: 100.0000 mg | ORAL_CAPSULE | Freq: Three times a day (TID) | ORAL | 0 refills | Status: DC | PRN

## 2021-03-02 NOTE — Progress Notes (Signed)
We are sorry that you are not feeling well.  Here is how we plan to help! ? ?Based on your presentation I believe you most likely have A cough due to bacteria.  When patients have a fever and a productive cough with a change in color or increased sputum production, we are concerned about bacterial bronchitis.  If left untreated it can progress to pneumonia.  If your symptoms do not improve with your treatment plan it is important that you contact your provider.   I have prescribed Azithromyin 250 mg: two tablets now and then one tablet daily for 4 additonal days  ?  ?In addition you may use A prescription cough medication called Tessalon Perles 100mg. You may take 1-2 capsules every 8 hours as needed for your cough. ? ? ?From your responses in the eVisit questionnaire you describe inflammation in the upper respiratory tract which is causing a significant cough.  This is commonly called Bronchitis and has four common causes:   ?Allergies ?Viral Infections ?Acid Reflux ?Bacterial Infection ?Allergies, viruses and acid reflux are treated by controlling symptoms or eliminating the cause. An example might be a cough caused by taking certain blood pressure medications. You stop the cough by changing the medication. Another example might be a cough caused by acid reflux. Controlling the reflux helps control the cough. ? ?USE OF BRONCHODILATOR ("RESCUE") INHALERS: ?There is a risk from using your bronchodilator too frequently.  The risk is that over-reliance on a medication which only relaxes the muscles surrounding the breathing tubes can reduce the effectiveness of medications prescribed to reduce swelling and congestion of the tubes themselves.  Although you feel brief relief from the bronchodilator inhaler, your asthma may actually be worsening with the tubes becoming more swollen and filled with mucus.  This can delay other crucial treatments, such as oral steroid medications. If you need to use a bronchodilator  inhaler daily, several times per day, you should discuss this with your provider.  There are probably better treatments that could be used to keep your asthma under control.  ?   ?HOME CARE ?Only take medications as instructed by your medical team. ?Complete the entire course of an antibiotic. ?Drink plenty of fluids and get plenty of rest. ?Avoid close contacts especially the very young and the elderly ?Cover your mouth if you cough or cough into your sleeve. ?Always remember to wash your hands ?A steam or ultrasonic humidifier can help congestion.  ? ?GET HELP RIGHT AWAY IF: ?You develop worsening fever. ?You become short of breath ?You cough up blood. ?Your symptoms persist after you have completed your treatment plan ?MAKE SURE YOU  ?Understand these instructions. ?Will watch your condition. ?Will get help right away if you are not doing well or get worse. ?  ? ?Thank you for choosing an e-visit. ? ?Your e-visit answers were reviewed by a board certified advanced clinical practitioner to complete your personal care plan. Depending upon the condition, your plan could have included both over the counter or prescription medications. ? ?Please review your pharmacy choice. Make sure the pharmacy is open so you can pick up prescription now. If there is a problem, you may contact your provider through MyChart messaging and have the prescription routed to another pharmacy.  Your safety is important to us. If you have drug allergies check your prescription carefully.  ? ?For the next 24 hours you can use MyChart to ask questions about today's visit, request a non-urgent call back, or ask   for a work or school excuse. ?You will get an email in the next two days asking about your experience. I hope that your e-visit has been valuable and will speed your recovery. ? ?5-10 minutes spent reviewing and documenting in chart. ? ?

## 2021-03-24 ENCOUNTER — Telehealth: Payer: No Typology Code available for payment source | Admitting: Family

## 2021-03-24 DIAGNOSIS — A6000 Herpesviral infection of urogenital system, unspecified: Secondary | ICD-10-CM | POA: Diagnosis not present

## 2021-03-25 ENCOUNTER — Other Ambulatory Visit (HOSPITAL_COMMUNITY): Payer: Self-pay

## 2021-03-25 MED ORDER — VALACYCLOVIR HCL 1 G PO TABS
1000.0000 mg | ORAL_TABLET | Freq: Two times a day (BID) | ORAL | 0 refills | Status: AC
  Filled 2021-03-25: qty 20, 10d supply, fill #0

## 2021-03-25 NOTE — Progress Notes (Signed)
E-Visit for Vaginal Symptoms  We are sorry that you are not feeling well. Here is how we plan to help! Based on what you shared with me it looks like you: genital herpes outbreak. I will go ahead and send in medication for you today,however, it is very important you find a new PCP and make a follow up visit.   I have sent Valtrex 1000 mg twice a day 10 days.   GET HELP RIGHT AWAY IF:  You have pain in your lower abdomen ( pelvic area or over your ovaries) You develop nausea or vomiting You develop a fever Your discharge changes or worsens You have persistent pain with intercourse You develop shortness of breath, a rapid pulse, or you faint.  These symptoms could be signs of problems or infections that need to be evaluated by a medical provider now.  MAKE SURE YOU   Understand these instructions. Will watch your condition. Will get help right away if you are not doing well or get worse.  Thank you for choosing an e-visit.  Your e-visit answers were reviewed by a board certified advanced clinical practitioner to complete your personal care plan. Depending upon the condition, your plan could have included both over the counter or prescription medications.  Please review your pharmacy choice. Make sure the pharmacy is open so you can pick up prescription now. If there is a problem, you may contact your provider through [employer] and have the prescription routed to another pharmacy.  Your safety is important to [place]. If you have drug allergies check your prescription carefully.   For the next 24 hours you can use MyChart to ask questions about today's visit, request a non-urgent call back, or ask for a work or school excuse. You will get an email in the next two days asking about your experience. I hope that your e-visit has been valuable and will speed your recovery.  Approximately 5 minutes was spent documenting and reviewing patient's chart.

## 2021-04-27 ENCOUNTER — Telehealth (INDEPENDENT_AMBULATORY_CARE_PROVIDER_SITE_OTHER): Payer: No Typology Code available for payment source | Admitting: Nurse Practitioner

## 2021-04-27 ENCOUNTER — Other Ambulatory Visit: Payer: Self-pay

## 2021-04-27 ENCOUNTER — Encounter: Payer: Self-pay | Admitting: Nurse Practitioner

## 2021-04-27 ENCOUNTER — Other Ambulatory Visit (HOSPITAL_COMMUNITY): Payer: Self-pay

## 2021-04-27 DIAGNOSIS — A6000 Herpesviral infection of urogenital system, unspecified: Secondary | ICD-10-CM

## 2021-04-27 MED ORDER — VALACYCLOVIR HCL 500 MG PO TABS
500.0000 mg | ORAL_TABLET | Freq: Every day | ORAL | 0 refills | Status: AC
  Filled 2021-04-27: qty 30, 30d supply, fill #0

## 2021-04-27 NOTE — Patient Instructions (Signed)
Medication refill Genital herpes:  Will refill valtrex  Follow up:  Follow up for physical with Dr. Redmond Pulling

## 2021-04-27 NOTE — Progress Notes (Signed)
Virtual Visit via Telephone Note  I connected with Karen Mcbride on 04/27/21 at  2:00 PM EDT by telephone and verified that I am speaking with the correct person using two identifiers.  Location: Patient: home Provider: office   I discussed the limitations, risks, security and privacy concerns of performing an evaluation and management service by telephone and the availability of in person appointments. I also discussed with the patient that there may be a patient responsible charge related to this service. The patient expressed understanding and agreed to proceed.   History of Present Illness:  Patient presents today to establish care.  Patient states that her only health history is a history of genital herpes.  She was previously on Valtrex 500 mg daily for prophylaxis.  She does need a refill on this.  Patient states that she is not on any other prescription medications at this time.  She has no new issues or concerns today.  She would like to get a physical in the near future.  And get caught up on health maintenance. Denies f/c/s, n/v/d, hemoptysis, PND, chest pain or edema.       Observations/Objective:  Vitals with BMI 11/16/2019 10/26/2018 04/24/2018  Height - - -  Weight - - -  BMI - - -  Systolic [phone]  Diastolic 70 77 77  Pulse 92 110 88      Assessment and Plan:  Medication refill Genital herpes:  Will refill valtrex  Follow up:  Follow up for physical with Dr. Redmond Pulling     I discussed the assessment and treatment plan with the patient. The patient was provided an opportunity to ask questions and all were answered. The patient agreed with the plan and demonstrated an understanding of the instructions.   The patient was advised to call back or seek an in-person evaluation if the symptoms worsen or if the condition fails to improve as anticipated.  I provided 23 minutes of non-face-to-face time during this encounter.   Fenton Foy, NP

## 2021-05-03 ENCOUNTER — Other Ambulatory Visit (HOSPITAL_COMMUNITY): Payer: Self-pay

## 2021-06-02 ENCOUNTER — Other Ambulatory Visit: Payer: Self-pay | Admitting: Nurse Practitioner

## 2021-06-06 ENCOUNTER — Other Ambulatory Visit (HOSPITAL_COMMUNITY): Payer: Self-pay

## 2021-06-06 ENCOUNTER — Other Ambulatory Visit: Payer: Self-pay | Admitting: Nurse Practitioner

## 2021-06-14 ENCOUNTER — Other Ambulatory Visit (HOSPITAL_COMMUNITY): Payer: Self-pay

## 2021-06-14 ENCOUNTER — Other Ambulatory Visit: Payer: Self-pay | Admitting: Nurse Practitioner

## 2021-06-16 ENCOUNTER — Other Ambulatory Visit (HOSPITAL_COMMUNITY): Payer: Self-pay

## 2021-06-22 ENCOUNTER — Encounter: Payer: No Typology Code available for payment source | Admitting: Family Medicine

## 2021-08-23 ENCOUNTER — Telehealth: Payer: No Typology Code available for payment source | Admitting: Physician Assistant

## 2021-08-23 DIAGNOSIS — K0889 Other specified disorders of teeth and supporting structures: Secondary | ICD-10-CM

## 2021-08-23 MED ORDER — PENICILLIN V POTASSIUM 500 MG PO TABS: 500.0000 mg | ORAL_TABLET | Freq: Three times a day (TID) | ORAL | 0 refills | Status: AC

## 2021-08-23 MED ORDER — NAPROXEN 500 MG PO TABS: 500.0000 mg | ORAL_TABLET | Freq: Two times a day (BID) | ORAL | 0 refills | Status: DC

## 2021-08-23 NOTE — Progress Notes (Signed)
E-Visit for Dental Pain  We are sorry that you are not feeling well.  Here is how we plan to help!  Based on what you have shared with me in the questionnaire, it sounds like you have an infection related to a cavity or broken tooth.  naprosyn 500mg  2 times per day for 7 days for discomfort and PCN 500mg  3 times per day for 10 days  DO NOT TAKE either of these medications if you think you may be pregnant.   It is imperative that you see a dentist within 10 days of this eVisit to determine the cause of the dental pain and be sure it is adequately treated  A toothache or tooth pain is caused when the nerve in the root of a tooth or surrounding a tooth is irritated. Dental (tooth) infection, decay, injury, or loss of a tooth are the most common causes of dental pain. Pain may also occur after an extraction (tooth is pulled out). Pain sometimes originates from other areas and radiates to the jaw, thus appearing to be tooth pain.Bacteria growing inside your mouth can contribute to gum disease and dental decay, both of which can cause pain. A toothache occurs from inflammation of the central portion of the tooth called pulp. The pulp contains nerve endings that are very sensitive to pain. Inflammation to the pulp or pulpitis may be caused by dental cavities, trauma, and infection.    HOME CARE:   For toothaches: Over-the-counter pain medications such as acetaminophen or ibuprofen may be used. Take these as directed on the package while you arrange for a dental appointment. Avoid very cold or hot foods, because they may make the pain worse. You may get relief from biting on a cotton ball soaked in oil of cloves. You can get oil of cloves at most drug stores.  For jaw pain:  Aspirin may be helpful for problems in the joint of the jaw in adults. If pain happens every time you open your mouth widely, the temporomandibular joint (TMJ) may be the source of the pain. Yawning or taking a large bite of food  may worsen the pain. An appointment with your doctor or dentist will help you find the cause.     GET HELP RIGHT AWAY IF:  You have a high fever or chills If you have had a recent head or face injury and develop headache, light headedness, nausea, vomiting, or other symptoms that concern you after an injury to your face or mouth, you could have a more serious injury in addition to your dental injury. A facial rash associated with a toothache: This condition may improve with medication. Contact your doctor for them to decide what is appropriate. Any jaw pain occurring with chest pain: Although jaw pain is most commonly caused by dental disease, it is sometimes referred pain from other areas. People with heart disease, especially people who have had stents placed, people with diabetes, or those who have had heart surgery may have jaw pain as a symptom of heart attack or angina. If your jaw or tooth pain is associated with lightheadedness, sweating, or shortness of breath, you should see a doctor as soon as possible. Trouble swallowing or excessive pain or bleeding from gums: If you have a history of a weakened immune system, diabetes, or steroid use, you may be more susceptible to infections. Infections can often be more severe and extensive or caused by unusual organisms. Dental and gum infections in people with these conditions may  require more aggressive treatment. An abscess may need draining or IV antibiotics, for example.  MAKE SURE YOU   Understand these instructions. Will watch your condition. Will get help right away if you are not doing well or get worse.  Thank you for choosing an e-visit.  Your e-visit answers were reviewed by a board certified advanced clinical practitioner to complete your personal care plan. Depending upon the condition, your plan could have included both over the counter or prescription medications.  Please review your pharmacy choice. Make sure the pharmacy is  open so you can pick up prescription now. If there is a problem, you may contact your provider through [employer] and have the prescription routed to another pharmacy.  Your safety is important to [place]. If you have drug allergies check your prescription carefully.   For the next 24 hours you can use MyChart to ask questions about today's visit, request a non-urgent call back, or ask for a work or school excuse. You will get an email in the next two days asking about your experience. I hope that your e-visit has been valuable and will speed your recovery.  Greater than 5 minutes, yet less than 10 minutes of time have been spent researching, coordinating, and implementing care for this patient today

## 2021-10-08 ENCOUNTER — Telehealth: Payer: No Typology Code available for payment source | Admitting: Nurse Practitioner

## 2021-10-08 DIAGNOSIS — J029 Acute pharyngitis, unspecified: Secondary | ICD-10-CM

## 2021-10-08 NOTE — Progress Notes (Signed)
?  E-Visit for Sore Throat ? ?We are sorry that you are not feeling well.  Here is how we plan to help! ? ?Your symptoms indicate a likely viral infection (Pharyngitis).   Pharyngitis is inflammation in the back of the throat which can cause a sore throat, scratchiness and sometimes difficulty swallowing.   Pharyngitis is typically caused by a respiratory virus and will just run its course.  Please keep in mind that your symptoms could last up to 10 days.  For throat pain, we recommend over the counter oral pain relief medications such as acetaminophen or aspirin, or anti-inflammatory medications such as ibuprofen or naproxen sodium.  Topical treatments such as oral throat lozenges or sprays may be used as needed.  Avoid close contact with loved ones, especially the very young and elderly.  Remember to wash your hands thoroughly throughout the day as this is the number one way to prevent the spread of infection and wipe down door knobs and counters with disinfectant. ? ?After careful review of your answers, I would not recommend an antibiotic for your condition.  Antibiotics should not be used to treat conditions that we suspect are caused by viruses like the virus that causes the common cold or flu. However, some people can have Strep with atypical symptoms. You may need formal testing in clinic or office to confirm if your symptoms continue or worsen. ? ?Providers prescribe antibiotics to treat infections caused by bacteria. Antibiotics are very powerful in treating bacterial infections when they are used properly.  To maintain their effectiveness, they should be used only when necessary.  Overuse of antibiotics has resulted in the development of super bugs that are resistant to treatment!   ? ?Home Care: ?Only take medications as instructed by your medical team. ?Do not drink alcohol while taking these medications. ?A steam or ultrasonic humidifier can help congestion.  You can place a towel over your head and  breathe in the steam from hot water coming from a faucet. ?Avoid close contacts especially the very young and the elderly. ?Cover your mouth when you cough or sneeze. ?Always remember to wash your hands. ? ?Get Help Right Away If: ?You develop worsening fever or throat pain. ?You develop a severe head ache or visual changes. ?Your symptoms persist after you have completed your treatment plan. ? ?Make sure you ?Understand these instructions. ?Will watch your condition. ?Will get help right away if you are not doing well or get worse. ? ? ?Thank you for choosing an e-visit. ? ?Your e-visit answers were reviewed by a board certified advanced clinical practitioner to complete your personal care plan. Depending upon the condition, your plan could have included both over the counter or prescription medications. ? ?Please review your pharmacy choice. Make sure the pharmacy is open so you can pick up prescription now. If there is a problem, you may contact your provider through [employer] and have the prescription routed to another pharmacy.  Your safety is important to [place]. If you have drug allergies check your prescription carefully.  ? ?For the next 24 hours you can use MyChart to ask questions about today's visit, request a non-urgent call back, or ask for a work or school excuse. ?You will get an email in the next two days asking about your experience. I hope that your e-visit has been valuable and will speed your recovery. ? ?5-10 minutes spent reviewing and documenting in chart. ? ?

## 2021-11-07 ENCOUNTER — Telehealth: Payer: No Typology Code available for payment source | Admitting: Physician Assistant

## 2021-11-07 DIAGNOSIS — M5442 Lumbago with sciatica, left side: Secondary | ICD-10-CM

## 2021-11-07 DIAGNOSIS — M5441 Lumbago with sciatica, right side: Secondary | ICD-10-CM

## 2021-11-07 MED ORDER — CYCLOBENZAPRINE HCL 10 MG PO TABS: 5.0000 mg | ORAL_TABLET | Freq: Three times a day (TID) | ORAL | 0 refills | Status: DC | PRN

## 2021-11-07 MED ORDER — NAPROXEN 500 MG PO TABS: 500.0000 mg | ORAL_TABLET | Freq: Two times a day (BID) | ORAL | 0 refills | Status: DC

## 2021-11-07 NOTE — Progress Notes (Signed)

## 2021-12-22 ENCOUNTER — Telehealth: Payer: Self-pay | Admitting: Family Medicine

## 2021-12-22 NOTE — Telephone Encounter (Signed)
Medication Refill - Medication:  valtex 1000 mg tablet Valtex 500 mg tablet  Has the patient contacted their pharmacy?   (Agent: If yes, when and what did the pharmacy advise?) contact provider  Preferred Pharmacy (with phone number or street name[facility]  [id number] [place]  [phone] Fax:  [phone]    Has the patient been seen for an appointment in the last year OR does the patient have an upcoming appointment? Yes.    Advised patient medications has been discontinued, patient inquiring if provider can write a new rx

## 2021-12-28 ENCOUNTER — Telehealth: Payer: No Typology Code available for payment source | Admitting: Physician Assistant

## 2021-12-28 DIAGNOSIS — A6004 Herpesviral vulvovaginitis: Secondary | ICD-10-CM | POA: Diagnosis not present

## 2021-12-28 MED ORDER — VALACYCLOVIR HCL 500 MG PO TABS: 500.0000 mg | ORAL_TABLET | Freq: Two times a day (BID) | ORAL | 0 refills | Status: DC

## 2021-12-28 NOTE — Progress Notes (Signed)
E-Visit for Herpes Simplex  We are sorry that you are not feeling well.  Here is how we plan to help!  Based on what you have shared ith me, it looks like you may be having an outbreak/flare-up of genital herpes.    I have prescribed I have prescribed Valacyclovir 500 mg Take one by mouth twice a day for 3 days.    If you have been prescribed long term medications to be taken on a regular basis, it is important to follow the recommendations and take them as ordered.    Outbreaks usually include blisters and open sores in the genital area. Outbreaks that happen after the first time are usually not as severe and do not last as long. Genital Herpes Simplex is a commonly sexually transmitted viral infection that is found worldwide. Most of these genital infections are caused by one or two herpes simplex viruses that is passed from person to person during vaginal, oral, or anal sex. Sometimes, people do not know they have herpes because they do not have any symptoms.  Please be aware that if you have genital herpes you can be contagious even when you are not having rash or flare-up and you may not have any symptoms, even when you are taking suppressive medicines.  Herpes cannot be cured. The disease usually causes most problems during the first few years. After that, the virus is still there, but it causes few to no symptoms. Even when the virus is active, people with herpes can take medicines to reduce and help prevent symptoms.  Herpes is an infection that can cause blisters and open sores on the genital area. Herpes is caused by a virus that is passed from person to person during vaginal, oral, or anal sex. Sometimes, people do not know they have herpes because they do not have any symptoms. Herpes cannot be cured. The disease usually causes most problems during the first few years. After that, the virus is still there, but it causes few to no symptoms. Even when the virus is active, people with herpes  can take medicines to reduce and help prevent symptoms.  If you have been prescribed medications to be taken on a regular basis, it is important to follow the recommendations and take them as ordered.  Some people with herpes never have any symptoms. But other people can develop symptoms within a few weeks of being infected with the herpes virus   Symptoms usually include blisters in the genital area. In women, this area includes the vagina, buttocks, anus, or thighs. In men, this area includes the penis, scrotum, anus, butt, or thighs. The blisters can become painful open sores, which then crust over as they heal. Sometimes, people can have other symptoms that include:  ?Blisters on the mouth or lips ?Fever, headache, or pain in the joints ?Trouble urinating  Outbreaks might occur every month or more often, or just once or twice a year. Sometimes, people can tell when an outbreak will occur, because they feel itching or pain beforehand. Sometimes they do not know that an outbreak is coming because they have no symptoms. Whatever your pattern is, keep in mind that herpes outbreaks usually become less frequent over time as you get older. Certain things, called "triggers," can make outbreaks more likely to occur. These include stress, sunlight, menstrual periods,or getting sick.  Antiviral therapy can shorten the duration of symptoms and signs in primary infection, which, when untreated, can be associated with significant increase in the  symptoms of the disease.  HOME CARE Use a portable bath (such as a "Sitz bath") where you can sit in warm water for about 20 minutes. Your bathtub could also work. Avoid bubble baths.  Keep the genital area clean and dry and avoid tight clothes.  Take over-the-counter pain medicine such as acetaminophen (brand name: Tylenol) or ibuprofen sample brand names: Advil, Motrin). But avoid aspirin.  Only take medications as instructed by your medical team.  You are  most likely to spread herpes to a sex partner when you have blisters and open sores on your body. But it's also possible to spread herpes to your partner when you do not have any symptoms. That is because herpes can be present on your body without causing any symptoms, like blisters or pain.  Telling your sex partner that you have herpes can be hard. But it can help protect them, since there are ways to lower the risk of spreading the infection.   Using a condom every time you have sex  Not having sex when you have symptoms  Not having oral sex if you have blisters or open sores (in the genital area or around your mouth)  MAKE SURE YOU   Understand these instructions. Do not have sex without using a condom until you have been seen by a doctor and as instructed by the provider If you are not better or improved within 7 days, you MUST have a follow up at your doctor or the health department for evaluation. There are other causes of rashes in the genital region.  Thank you for choosing an e-visit.  Your e-visit answers were reviewed by a board certified advanced clinical practitioner to complete your personal care plan. Depending upon the condition, your plan could have included both over the counter or prescription medications.  Please review your pharmacy choice. Make sure the pharmacy is open so you can pick up prescription now. If there is a problem, you may contact your provider through [employer] and have the prescription routed to another pharmacy.  Your safety is important to [place]. If you have drug allergies check your prescription carefully.   For the next 24 hours you can use MyChart to ask questions about today's visit, request a non-urgent call back, or ask for a work or school excuse. You will get an email in the next two days asking about your experience. I hope that your e-visit has been valuable and will speed your recovery.     I provided 5 minutes of non face-to-face  time during this encounter for chart review and documentation.

## 2022-01-25 ENCOUNTER — Other Ambulatory Visit (HOSPITAL_COMMUNITY)
Admission: RE | Admit: 2022-01-25 | Discharge: 2022-01-25 | Disposition: A | Discharge status: Home / Self Care | Payer: No Typology Code available for payment source | Source: Ambulatory Visit | Attending: Family Medicine | Admitting: Family Medicine

## 2022-01-25 ENCOUNTER — Encounter: Payer: Self-pay | Admitting: Family Medicine

## 2022-01-25 ENCOUNTER — Ambulatory Visit (INDEPENDENT_AMBULATORY_CARE_PROVIDER_SITE_OTHER): Payer: No Typology Code available for payment source | Admitting: Family Medicine

## 2022-01-25 VITALS — BP 126/84 | HR 78 | Temp 98.1°F | Resp 16 | Ht 64.0 in | Wt 177.0 lb

## 2022-01-25 DIAGNOSIS — Z7689 Persons encountering health services in other specified circumstances: Secondary | ICD-10-CM

## 2022-01-25 DIAGNOSIS — Z124 Encounter for screening for malignant neoplasm of cervix: Secondary | ICD-10-CM

## 2022-01-25 DIAGNOSIS — Z0000 Encounter for general adult medical examination without abnormal findings: Secondary | ICD-10-CM | POA: Diagnosis not present

## 2022-01-25 DIAGNOSIS — Z130 Encounter for screening for diseases of the blood and blood-forming organs and certain disorders involving the immune mechanism: Secondary | ICD-10-CM

## 2022-01-25 DIAGNOSIS — Z13220 Encounter for screening for lipoid disorders: Secondary | ICD-10-CM

## 2022-01-25 DIAGNOSIS — Z1159 Encounter for screening for other viral diseases: Secondary | ICD-10-CM

## 2022-01-26 LAB — LIPID PANEL
Chol/HDL Ratio: 4.4 ratio (ref 0.0–4.4)
Cholesterol, Total: 192 mg/dL (ref 100–199)
HDL: 44 mg/dL (ref >39)
LDL Chol Calc (NIH): 133 mg/dL — ABNORMAL HIGH (ref 0–99)
Triglycerides: 83 mg/dL (ref 0–149)
VLDL Cholesterol Cal: 15 mg/dL (ref 5–40)

## 2022-01-26 LAB — CMP14+EGFR
ALT: 10 IU/L (ref 0–32)
AST: 9 IU/L (ref 0–40)
Albumin/Globulin Ratio: 1.7 (ref 1.2–2.2)
Albumin: 4.7 g/dL (ref 3.9–4.9)
Alkaline Phosphatase: 54 IU/L (ref 44–121)
BUN/Creatinine Ratio: 12 (ref 9–23)
BUN: 9 mg/dL (ref 6–20)
Bilirubin Total: 0.5 mg/dL (ref 0.0–1.2)
CO2: 23 mmol/L (ref 20–29)
Calcium: 9.6 mg/dL (ref 8.7–10.2)
Chloride: 101 mmol/L (ref 96–106)
Creatinine, Ser: 0.75 mg/dL (ref 0.57–1.00)
Globulin, Total: 2.7 g/dL (ref 1.5–4.5)
Glucose: 80 mg/dL (ref 70–99)
Potassium: 4.3 mmol/L (ref 3.5–5.2)
Sodium: 138 mmol/L (ref 134–144)
Total Protein: 7.4 g/dL (ref 6.0–8.5)
eGFR: 108 mL/min/{1.73_m2} (ref >59)

## 2022-01-26 LAB — HEMOGLOBIN A1C
Est. average glucose Bld gHb Est-mCnc: 111 mg/dL
Hgb A1c MFr Bld: 5.5 % (ref 4.8–5.6)

## 2022-01-26 LAB — CBC WITH DIFFERENTIAL/PLATELET
Basophils Absolute: 0 10*3/uL (ref 0.0–0.2)
Basos: 1 %
EOS (ABSOLUTE): 0.1 10*3/uL (ref 0.0–0.4)
Eos: 2 %
Hematocrit: 37.2 % (ref 34.0–46.6)
Hemoglobin: 12.5 g/dL (ref 11.1–15.9)
Immature Grans (Abs): 0 10*3/uL (ref 0.0–0.1)
Immature Granulocytes: 0 %
Lymphocytes Absolute: 1.8 10*3/uL (ref 0.7–3.1)
Lymphs: 45 %
MCH: 29.6 pg (ref 26.6–33.0)
MCHC: 33.6 g/dL (ref 31.5–35.7)
MCV: 88 fL (ref 79–97)
Monocytes Absolute: 0.3 10*3/uL (ref 0.1–0.9)
Monocytes: 7 %
Neutrophils Absolute: 1.8 10*3/uL (ref 1.4–7.0)
Neutrophils: 45 %
Platelets: 233 10*3/uL (ref 150–450)
RBC: 4.23 x10E6/uL (ref 3.77–5.28)
RDW: 13.4 % (ref 11.7–15.4)
WBC: 3.9 10*3/uL (ref 3.4–10.8)

## 2022-01-26 LAB — CERVICOVAGINAL ANCILLARY ONLY
Bacterial Vaginitis (gardnerella): POSITIVE — AB
Candida Glabrata: NEGATIVE
Candida Vaginitis: NEGATIVE
Chlamydia: NEGATIVE
Comment: NEGATIVE
Comment: NEGATIVE
Comment: NEGATIVE
Comment: NEGATIVE
Comment: NEGATIVE
Comment: NORMAL
Neisseria Gonorrhea: NEGATIVE
Trichomonas: POSITIVE — AB

## 2022-01-26 LAB — TSH: TSH: 1.81 u[IU]/mL (ref 0.450–4.500)

## 2022-01-26 LAB — HEPATITIS C ANTIBODY: Hep C Virus Ab: NONREACTIVE

## 2022-01-27 ENCOUNTER — Other Ambulatory Visit: Payer: Self-pay | Admitting: *Deleted

## 2022-01-27 ENCOUNTER — Other Ambulatory Visit: Payer: Self-pay | Admitting: Family Medicine

## 2022-01-27 ENCOUNTER — Encounter: Payer: Self-pay | Admitting: Family Medicine

## 2022-01-27 DIAGNOSIS — A6004 Herpesviral vulvovaginitis: Secondary | ICD-10-CM

## 2022-01-27 MED ORDER — VALACYCLOVIR HCL 500 MG PO TABS: 500.0000 mg | ORAL_TABLET | Freq: Two times a day (BID) | ORAL | 0 refills | Status: DC

## 2022-01-27 MED ORDER — METRONIDAZOLE 500 MG PO TABS: 500.0000 mg | ORAL_TABLET | Freq: Two times a day (BID) | ORAL | 0 refills | Status: AC

## 2022-01-27 NOTE — Telephone Encounter (Signed)
Please advise patient / ok for referral

## 2022-01-27 NOTE — Progress Notes (Signed)
New Patient Office Visit  Subjective    Patient ID: Karen Mcbride, female    DOB: May 12, 1991  Age: 31 y.o. MRN: [id number]  CC:  Chief Complaint  Patient presents with   Annual Exam   Gynecologic Exam    HPI Karen Mcbride presents to establish care and for routine annual exam. Patient denies acute complaints or concerns.    Outpatient Encounter Medications as of 01/25/2022  Medication Sig   cetirizine-pseudoephedrine (ZYRTEC-D) 5-120 MG tablet Take 1 tablet by mouth daily.   cyclobenzaprine (FLEXERIL) 10 MG tablet Take 0.5-1 tablets (5-10 mg total) by mouth 3 (three) times daily as needed for muscle spasms.   naproxen (NAPROSYN) 500 MG tablet Take 1 tablet (500 mg total) by mouth 2 (two) times daily with a meal.   valACYclovir (VALTREX) 500 MG tablet Take 1 tablet (500 mg total) by mouth 2 (two) times daily.   No facility-administered encounter medications on file as of 01/25/2022.    Past Medical History:  Diagnosis Date   Depression    Genital herpes     Past Surgical History:  Procedure Laterality Date   TONSILLECTOMY      Family History  Problem Relation Age of Onset   Diabetes Maternal Aunt    Diabetes Maternal Grandfather    Diverticulosis Mother     Social History   Socioeconomic History   Marital status: Single    Spouse name: Not on file   Number of children: Not on file   Years of education: Not on file   Highest education level: Not on file  Occupational History   Not on file  Tobacco Use   Smoking status: Every Day    Packs/day: 0.25    Years: 4.00    Total pack years: 1.00    Types: Cigarettes    Last attempt to quit: 11/30/2016    Years since quitting: 5.1   Smokeless tobacco: Never  Substance and Sexual Activity   Alcohol use: Yes    Comment: occasion   Drug use: No   Sexual activity: Yes    Birth control/protection: None  Other Topics Concern   Not on file  Social History Narrative   Not on file   Social Determinants of  Health   Financial Resource Strain: Not on file  Food Insecurity: Not on file  Transportation Needs: Not on file  Physical Activity: Not on file  Stress: Not on file  Social Connections: Not on file  Intimate Partner Violence: Not on file    Review of Systems  All other systems reviewed and are negative.       Objective    BP 126/84   Pulse 78   Temp 98.1 F (36.7 C) (Oral)   Resp 16   Ht 5' 4"  (1.626 m)   Wt 177 lb (80.3 kg)   SpO2 98%   BMI 30.38 kg/m   Physical Exam Vitals and nursing note reviewed.  Constitutional:      General: She is not in acute distress. HENT:     Head: Normocephalic and atraumatic.     Right Ear: Tympanic membrane, ear canal and external ear normal.     Left Ear: Tympanic membrane, ear canal and external ear normal.     Nose: Nose normal.     Mouth/Throat:     Mouth: Mucous membranes are moist.     Pharynx: Oropharynx is clear.  Eyes:     Conjunctiva/sclera: Conjunctivae normal.  Pupils: Pupils are equal, round, and reactive to light.  Neck:     Thyroid: No thyromegaly.  Cardiovascular:     Rate and Rhythm: Normal rate and regular rhythm.     Heart sounds: Normal heart sounds. No murmur heard. Pulmonary:     Effort: Pulmonary effort is normal. No respiratory distress.     Breath sounds: Normal breath sounds.  Abdominal:     General: There is no distension.     Palpations: Abdomen is soft. There is no mass.     Tenderness: There is no abdominal tenderness.     Hernia: There is no hernia in the left inguinal area or right inguinal area.  Genitourinary:    Exam position: Supine.     Labia:        Right: No lesion.        Left: No lesion.      Vagina: Normal.     Cervix: Normal.     Uterus: Normal.      Adnexa: Right adnexa normal.  Musculoskeletal:        General: Normal range of motion.     Cervical back: Normal range of motion and neck supple.  Skin:    General: Skin is warm and dry.  Neurological:     General: No  focal deficit present.     Mental Status: She is alert and oriented to person, place, and time.  Psychiatric:        Mood and Affect: Mood normal.        Behavior: Behavior normal.         Assessment & Plan:   1. Annual physical exam  - CMP14+EGFR  2. Pap smear for cervical cancer screening  - Cytology - PAP - Cervicovaginal ancillary only  3. Screening for deficiency anemia  - CBC with Differential  4. Screening for lipid disorders  - Lipid Panel  5. Screening for endocrine/metabolic/immunity disorders  - TSH - Hemoglobin A1c  6. Need for hepatitis C screening test  - Hepatitis C Antibody  7. Encounter to establish care   Return in about 1 year (around 01/26/2023) for physical.   Becky Sax, MD

## 2022-01-30 ENCOUNTER — Other Ambulatory Visit: Payer: Self-pay | Admitting: Family Medicine

## 2022-01-30 DIAGNOSIS — A6004 Herpesviral vulvovaginitis: Secondary | ICD-10-CM

## 2022-01-30 NOTE — Telephone Encounter (Signed)
Medication Refill - Medication: valACYclovir (VALTREX) 500 MG tablet  Has the patient contacted their pharmacy? No.   Preferred Pharmacy (with phone number or street name):   [place] Phone:  [phone]  Fax:  [phone]     Has the patient been seen for an appointment in the last year OR does the patient have an upcoming appointment? Yes.

## 2022-01-31 ENCOUNTER — Other Ambulatory Visit (HOSPITAL_COMMUNITY): Payer: Self-pay

## 2022-01-31 MED ORDER — VALACYCLOVIR HCL 500 MG PO TABS
500.0000 mg | ORAL_TABLET | Freq: Two times a day (BID) | ORAL | 0 refills | Status: DC
  Filled 2022-01-31: qty 90, 45d supply, fill #0

## 2022-01-31 NOTE — Telephone Encounter (Signed)
Changed pharmacy  Requested Prescriptions  Pending Prescriptions Disp Refills  . valACYclovir (VALTREX) 500 MG tablet 90 tablet 0    Sig: Take 1 tablet (500 mg total) by mouth 2 (two) times daily.     Antimicrobials:  Antiviral Agents - Anti-Herpetic Passed - 01/30/2022 11:31 AM      Passed - Valid encounter within last 12 months    Recent Outpatient Visits          6 days ago Annual physical exam   [facility] at [facility], MD   9 months ago Genital herpes simplex, unspecified site   Primary Care at [facility], Kriste Basque, NP

## 2022-02-02 LAB — CYTOLOGY - PAP
Adequacy: ABSENT
Diagnosis: NEGATIVE

## 2022-02-03 ENCOUNTER — Other Ambulatory Visit: Payer: Self-pay | Admitting: Family

## 2022-02-03 DIAGNOSIS — A5901 Trichomonal vulvovaginitis: Secondary | ICD-10-CM

## 2022-06-05 ENCOUNTER — Other Ambulatory Visit: Payer: Self-pay | Admitting: Family Medicine

## 2022-06-05 DIAGNOSIS — A6004 Herpesviral vulvovaginitis: Secondary | ICD-10-CM

## 2022-06-06 ENCOUNTER — Other Ambulatory Visit (HOSPITAL_COMMUNITY): Payer: Self-pay

## 2022-06-06 MED ORDER — VALACYCLOVIR HCL 500 MG PO TABS
500.0000 mg | ORAL_TABLET | Freq: Two times a day (BID) | ORAL | 0 refills | Status: DC
  Filled 2022-06-06 – 2022-09-03 (×3): qty 90, 45d supply, fill #0

## 2022-06-07 ENCOUNTER — Telehealth: Payer: No Typology Code available for payment source | Admitting: Physician Assistant

## 2022-06-07 DIAGNOSIS — A6004 Herpesviral vulvovaginitis: Secondary | ICD-10-CM | POA: Diagnosis not present

## 2022-06-07 MED ORDER — VALACYCLOVIR HCL 500 MG PO TABS: 500.0000 mg | ORAL_TABLET | Freq: Two times a day (BID) | ORAL | 0 refills | Status: AC

## 2022-06-07 NOTE — Progress Notes (Signed)
E-Visit for Herpes Simplex  We are sorry that you are not feeling well.  Here is how we plan to help!  Based on what you have shared ith me, it looks like you may be having an outbreak/flare-up of genital herpes.    I have prescribed I have prescribed Valacyclovir 500 mg Take one by mouth twice a day for 3 days.    Being that we are an urgent care facility we are able to prescribe medications for the current outbreak, but not refills for suppression therapy. However, it does appear your PCP office has prescribed the Valacyclovir 90 tablets as well to the pharmacy for you.   If you have been prescribed long term medications to be taken on a regular basis, it is important to follow the recommendations and take them as ordered.    Outbreaks usually include blisters and open sores in the genital area. Outbreaks that happen after the first time are usually not as severe and do not last as long. Genital Herpes Simplex is a commonly sexually transmitted viral infection that is found worldwide. Most of these genital infections are caused by one or two herpes simplex viruses that is passed from person to person during vaginal, oral, or anal sex. Sometimes, people do not know they have herpes because they do not have any symptoms.  Please be aware that if you have genital herpes you can be contagious even when you are not having rash or flare-up and you may not have any symptoms, even when you are taking suppressive medicines.  Herpes cannot be cured. The disease usually causes most problems during the first few years. After that, the virus is still there, but it causes few to no symptoms. Even when the virus is active, people with herpes can take medicines to reduce and help prevent symptoms.  Herpes is an infection that can cause blisters and open sores on the genital area. Herpes is caused by a virus that is passed from person to person during vaginal, oral, or anal sex. Sometimes, people do not know they  have herpes because they do not have any symptoms. Herpes cannot be cured. The disease usually causes most problems during the first few years. After that, the virus is still there, but it causes few to no symptoms. Even when the virus is active, people with herpes can take medicines to reduce and help prevent symptoms.  If you have been prescribed medications to be taken on a regular basis, it is important to follow the recommendations and take them as ordered.  Some people with herpes never have any symptoms. But other people can develop symptoms within a few weeks of being infected with the herpes virus   Symptoms usually include blisters in the genital area. In women, this area includes the vagina, buttocks, anus, or thighs. In men, this area includes the penis, scrotum, anus, butt, or thighs. The blisters can become painful open sores, which then crust over as they heal. Sometimes, people can have other symptoms that include:  ?Blisters on the mouth or lips ?Fever, headache, or pain in the joints ?Trouble urinating  Outbreaks might occur every month or more often, or just once or twice a year. Sometimes, people can tell when an outbreak will occur, because they feel itching or pain beforehand. Sometimes they do not know that an outbreak is coming because they have no symptoms. Whatever your pattern is, keep in mind that herpes outbreaks usually become less frequent over time as you  get older. Certain things, called "triggers," can make outbreaks more likely to occur. These include stress, sunlight, menstrual periods,or getting sick.  Antiviral therapy can shorten the duration of symptoms and signs in primary infection, which, when untreated, can be associated with significant increase in the symptoms of the disease.  HOME CARE Use a portable bath (such as a "Sitz bath") where you can sit in warm water for about 20 minutes. Your bathtub could also work. Avoid bubble baths.  Keep the genital  area clean and dry and avoid tight clothes.  Take over-the-counter pain medicine such as acetaminophen (brand name: Tylenol) or ibuprofen sample brand names: Advil, Motrin). But avoid aspirin.  Only take medications as instructed by your medical team.  You are most likely to spread herpes to a sex partner when you have blisters and open sores on your body. But it's also possible to spread herpes to your partner when you do not have any symptoms. That is because herpes can be present on your body without causing any symptoms, like blisters or pain.  Telling your sex partner that you have herpes can be hard. But it can help protect them, since there are ways to lower the risk of spreading the infection.   Using a condom every time you have sex  Not having sex when you have symptoms  Not having oral sex if you have blisters or open sores (in the genital area or around your mouth)  MAKE SURE YOU   Understand these instructions. Do not have sex without using a condom until you have been seen by a doctor and as instructed by the provider If you are not better or improved within 7 days, you MUST have a follow up at your doctor or the health department for evaluation. There are other causes of rashes in the genital region.  Thank you for choosing an e-visit.  Your e-visit answers were reviewed by a board certified advanced clinical practitioner to complete your personal care plan. Depending upon the condition, your plan could have included both over the counter or prescription medications.  Please review your pharmacy choice. Make sure the pharmacy is open so you can pick up prescription now. If there is a problem, you may contact your provider through [employer] and have the prescription routed to another pharmacy.  Your safety is important to [place]. If you have drug allergies check your prescription carefully.   For the next 24 hours you can use MyChart to ask questions about today's visit,  request a non-urgent call back, or ask for a work or school excuse. You will get an email in the next two days asking about your experience. I hope that your e-visit has been valuable and will speed your recovery.   I have spent 5 minutes in review of e-visit questionnaire, review and updating patient chart, medical decision making and response to patient.   Mar Daring, PA-C

## 2022-06-15 ENCOUNTER — Other Ambulatory Visit (HOSPITAL_COMMUNITY): Payer: Self-pay

## 2022-07-28 ENCOUNTER — Other Ambulatory Visit (HOSPITAL_COMMUNITY): Payer: Self-pay

## 2022-07-28 ENCOUNTER — Other Ambulatory Visit: Payer: Self-pay

## 2022-07-31 ENCOUNTER — Other Ambulatory Visit (HOSPITAL_COMMUNITY): Payer: Self-pay

## 2022-07-31 ENCOUNTER — Encounter (HOSPITAL_COMMUNITY): Payer: Self-pay

## 2022-08-04 ENCOUNTER — Other Ambulatory Visit: Payer: Self-pay

## 2022-09-03 ENCOUNTER — Other Ambulatory Visit: Payer: Self-pay

## 2022-09-22 ENCOUNTER — Encounter: Payer: Self-pay | Admitting: Family Medicine

## 2022-09-27 ENCOUNTER — Other Ambulatory Visit: Payer: Self-pay | Admitting: Family Medicine

## 2022-12-15 ENCOUNTER — Telehealth: Payer: 59 | Admitting: Family Medicine

## 2022-12-15 DIAGNOSIS — M79673 Pain in unspecified foot: Secondary | ICD-10-CM

## 2022-12-15 MED ORDER — INDOMETHACIN 50 MG PO CAPS: 50.0000 mg | ORAL_CAPSULE | Freq: Three times a day (TID) | ORAL | 0 refills | Status: AC

## 2022-12-15 NOTE — Progress Notes (Signed)
E-Visit for Gout Symptoms  We are sorry that you are not feeling well. We are here to help!  Based on what you shared with me it looks like you have a flare of your gout.  Gout is a form of arthritis. It can cause pain and swelling in the joints. At first, it tends to affect only 1 joint - most frequently the big toe. It happens in people who have too much uric acid in the blood. Uric acid is a chemical that is produced when the body breaks down certain foods. Uric acid can form sharp needle-like crystals that build up in the joints and cause pain. Uric acid crystals can also form inside the tubes that carry urine from the kidneys to the bladder. These crystals can turn into "kidney stones" that can cause pain and problems with the flow of urine. People with gout get sudden "flares" or attacks of severe pain, most often the big toe, ankle, or knee. Often the joint also turns red and swells. Usually, only 1 joint is affected, but some people have pain in more than 1 joint. Gout flares tend to happen more often during the night.  The pain from gout can be extreme. The pain and swelling are worst at the beginning of a gout flare. The symptoms then get better within a few days to weeks. It is not clear how the body "turns off" a gout flare.  Do not start any NEW preventative medicine until the gout has cleared completely. However, If you are already on Probenecid or Allopurinol for CHRONIC gout, you may continue taking this during an active flare up  I have prescribed Indomethacin 50mg three times daily for moderate to severe pain for no more than 7 days   HOME CARE Losing weight can help relieve gout. It's not clear that following a specific diet plan will help with gout symptoms but eating a balanced diet can help improve your overall health. It can also help you lose weight, if you are overweight. In general, a healthy diet includes plenty of fruits, vegetables, whole grains, and low-fat dairy  products (labelled "low fat", skim, 2%). Avoid sugar sweetened drinks (including sodas, tea, juice and juice blends, coffee drinks and sports drinks) Limit alcohol to 1-2 drinks of beer, spirits or wine daily these can make gout flares worse. Some people with gout also have other health problems, such as heart disease, high blood pressure, kidney disease, or obesity. If you have any of these issues, it's important to work with your doctor to manage them. This can help improve your overall health and might also help with your gout.  GET HELP RIGHT AWAY IF: Your symptoms persist after you have completed your treatment plan You develop severe diarrhea You develop abnormal sensations  You develop vomiting,   You develop weakness  You develop abdominal pain  FOLLOW UP WITH YOUR PRIMARY PROVIDER IF: If your symptoms do not improve within 10 days  MAKE SURE YOU  Understand these instructions. Will watch your condition. Will get help right away if you are not doing well or get worse.  Thank you for choosing an e-visit.  Your e-visit answers were reviewed by a board certified advanced clinical practitioner to complete your personal care plan. Depending upon the condition, your plan could have included both over the counter or prescription medications.  Please review your pharmacy choice. Make sure the pharmacy is open so you can pick up prescription now. If there is a problem,   you may contact your provider through MyChart messaging and have the prescription routed to another pharmacy.  Your safety is important to us. If you have drug allergies check your prescription carefully.   For the next 24 hours you can use MyChart to ask questions about today's visit, request a non-urgent call back, or ask for a work or school excuse. You will get an email in the next two days asking about your experience. I hope that your e-visit has been valuable and will speed your recovery.    have provided 5 minutes of  non face to face time during this encounter for chart review and documentation.   

## 2023-02-06 ENCOUNTER — Telehealth: Payer: 59 | Admitting: Physician Assistant

## 2023-02-06 DIAGNOSIS — N760 Acute vaginitis: Secondary | ICD-10-CM

## 2023-02-06 DIAGNOSIS — N898 Other specified noninflammatory disorders of vagina: Secondary | ICD-10-CM

## 2023-02-06 NOTE — Progress Notes (Signed)
Because BV does not cause vaginal lesions and this raises concern for other causes, I feel your condition warrants further evaluation and I recommend that you be seen in a face to face visit.   NOTE: There will be NO CHARGE for this eVisit   If you are having a true medical emergency please call 911.      For an urgent face to face visit, [place] has eight urgent care centers for your convenience:   NEW!! [facility] Center at [facility] Get Driving Directions [phone] [address] [place], [address]    [facility] Health Urgent Care Center at [facility] Get Driving Directions [phone] [address] [place], [place] [address]   [facility] Health Urgent Care Center [facility]) Get Driving Directions [phone] [address] [place], [place] [address]  [facility] Urgent Care Center [facility]) Get Driving Directions [phone] [address] [place],  [place]  [address]  [facility] Health Urgent Care Center [facility] - at [employer]  [phone] [address] [place],  [place] [address]   [facility] Health Urgent Care at [facility] Get Driving Directions [phone] [address] [place], [place] [address]   [facility] Health Urgent Care at [facility] Get Driving Directions  [phone] [address] [place], [place] [address]   [facility] Health Urgent Care at [facility] Directions [phone] [address], [address] [place], [place] [address]  Your MyChart E-visit questionnaire answers were reviewed by a board certified advanced clinical practitioner to complete your personal care plan based on your specific symptoms.  Thank you for using e-Visits.

## 2023-03-08 ENCOUNTER — Encounter: Payer: Self-pay | Admitting: Family Medicine

## 2023-03-08 ENCOUNTER — Other Ambulatory Visit (HOSPITAL_COMMUNITY)
Admission: RE | Admit: 2023-03-08 | Discharge: 2023-03-08 | Disposition: A | Discharge status: Home / Self Care | Payer: 59 | Source: Ambulatory Visit | Attending: Family Medicine | Admitting: Family Medicine

## 2023-03-08 ENCOUNTER — Ambulatory Visit (INDEPENDENT_AMBULATORY_CARE_PROVIDER_SITE_OTHER): Payer: 59 | Admitting: Family Medicine

## 2023-03-08 VITALS — BP 140/87 | HR 93 | Temp 98.3°F | Resp 16 | Ht 63.0 in | Wt 185.6 lb

## 2023-03-08 DIAGNOSIS — N760 Acute vaginitis: Secondary | ICD-10-CM | POA: Insufficient documentation

## 2023-03-08 DIAGNOSIS — Z13220 Encounter for screening for lipoid disorders: Secondary | ICD-10-CM

## 2023-03-08 DIAGNOSIS — E559 Vitamin D deficiency, unspecified: Secondary | ICD-10-CM | POA: Diagnosis not present

## 2023-03-08 DIAGNOSIS — Z0000 Encounter for general adult medical examination without abnormal findings: Secondary | ICD-10-CM

## 2023-03-08 DIAGNOSIS — Z01419 Encounter for gynecological examination (general) (routine) without abnormal findings: Secondary | ICD-10-CM

## 2023-03-08 DIAGNOSIS — A5901 Trichomonal vulvovaginitis: Secondary | ICD-10-CM | POA: Insufficient documentation

## 2023-03-08 DIAGNOSIS — Z113 Encounter for screening for infections with a predominantly sexual mode of transmission: Secondary | ICD-10-CM | POA: Diagnosis not present

## 2023-03-08 DIAGNOSIS — B9689 Other specified bacterial agents as the cause of diseases classified elsewhere: Secondary | ICD-10-CM | POA: Insufficient documentation

## 2023-03-08 DIAGNOSIS — Z130 Encounter for screening for diseases of the blood and blood-forming organs and certain disorders involving the immune mechanism: Secondary | ICD-10-CM | POA: Diagnosis not present

## 2023-03-08 NOTE — Progress Notes (Signed)
Annual Exam Referrels pap smear

## 2023-03-09 ENCOUNTER — Other Ambulatory Visit: Payer: Self-pay | Admitting: Family Medicine

## 2023-03-09 ENCOUNTER — Encounter: Payer: Self-pay | Admitting: Family Medicine

## 2023-03-09 ENCOUNTER — Other Ambulatory Visit (HOSPITAL_COMMUNITY): Payer: Self-pay

## 2023-03-09 ENCOUNTER — Telehealth: Payer: Self-pay | Admitting: Family Medicine

## 2023-03-09 ENCOUNTER — Other Ambulatory Visit: Payer: Self-pay

## 2023-03-09 DIAGNOSIS — A6004 Herpesviral vulvovaginitis: Secondary | ICD-10-CM

## 2023-03-09 LAB — CBC WITH DIFFERENTIAL/PLATELET
Basophils Absolute: 0 10*3/uL (ref 0.0–0.2)
Basos: 1 %
EOS (ABSOLUTE): 0.1 10*3/uL (ref 0.0–0.4)
Eos: 3 %
Hematocrit: 36.6 % (ref 34.0–46.6)
Hemoglobin: 12 g/dL (ref 11.1–15.9)
Immature Grans (Abs): 0 10*3/uL (ref 0.0–0.1)
Immature Granulocytes: 0 %
Lymphocytes Absolute: 1.6 10*3/uL (ref 0.7–3.1)
Lymphs: 40 %
MCH: 29.3 pg (ref 26.6–33.0)
MCHC: 32.8 g/dL (ref 31.5–35.7)
MCV: 89 fL (ref 79–97)
Monocytes Absolute: 0.3 10*3/uL (ref 0.1–0.9)
Monocytes: 7 %
Neutrophils Absolute: 2 10*3/uL (ref 1.4–7.0)
Neutrophils: 49 %
Platelets: 248 10*3/uL (ref 150–450)
RBC: 4.1 x10E6/uL (ref 3.77–5.28)
RDW: 13.7 % (ref 11.7–15.4)
WBC: 4 10*3/uL (ref 3.4–10.8)

## 2023-03-09 LAB — LIPID PANEL
Chol/HDL Ratio: 5.5 ratio — ABNORMAL HIGH (ref 0.0–4.4)
Cholesterol, Total: 193 mg/dL (ref 100–199)
HDL: 35 mg/dL — ABNORMAL LOW (ref >39)
LDL Chol Calc (NIH): 125 mg/dL — ABNORMAL HIGH (ref 0–99)
Triglycerides: 183 mg/dL — ABNORMAL HIGH (ref 0–149)
VLDL Cholesterol Cal: 33 mg/dL (ref 5–40)

## 2023-03-09 LAB — COMPREHENSIVE METABOLIC PANEL
ALT: 9 IU/L (ref 0–32)
AST: 15 IU/L (ref 0–40)
Albumin: 4.6 g/dL (ref 3.9–4.9)
Alkaline Phosphatase: 57 IU/L (ref 44–121)
BUN/Creatinine Ratio: 15 (ref 9–23)
BUN: 11 mg/dL (ref 6–20)
Bilirubin Total: 0.2 mg/dL (ref 0.0–1.2)
CO2: 22 mmol/L (ref 20–29)
Calcium: 9.2 mg/dL (ref 8.7–10.2)
Chloride: 104 mmol/L (ref 96–106)
Creatinine, Ser: 0.72 mg/dL (ref 0.57–1.00)
Globulin, Total: 2.2 g/dL (ref 1.5–4.5)
Glucose: 95 mg/dL (ref 70–99)
Potassium: 4.1 mmol/L (ref 3.5–5.2)
Sodium: 141 mmol/L (ref 134–144)
Total Protein: 6.8 g/dL (ref 6.0–8.5)
eGFR: 113 mL/min/{1.73_m2} (ref >59)

## 2023-03-09 LAB — VITAMIN D 25 HYDROXY (VIT D DEFICIENCY, FRACTURES): Vit D, 25-Hydroxy: 9.7 ng/mL — ABNORMAL LOW (ref 30.0–100.0)

## 2023-03-09 MED ORDER — VITAMIN D (ERGOCALCIFEROL) 1.25 MG (50000 UNIT) PO CAPS
50000.0000 [IU] | ORAL_CAPSULE | ORAL | 0 refills | Status: AC
  Filled 2023-03-09 – 2023-09-10 (×4): qty 12, 84d supply, fill #0

## 2023-03-09 NOTE — Progress Notes (Signed)
Established Patient Office Visit  Subjective    Patient ID: Karen Mcbride, female    DOB: May 20, 1991  Age: 32 y.o. MRN: [id number]  CC:  Chief Complaint  Patient presents with   Annual Exam    Referrels pap smear    HPI [name] presents for routine annual exam. Patient denies acute complaints or concerns.   Outpatient Encounter Medications as of 03/08/2023  Medication Sig   naproxen (NAPROSYN) 500 MG tablet Take 1 tablet (500 mg total) by mouth 2 (two) times daily with a meal.   valACYclovir (VALTREX) 500 MG tablet Take 1 tablet (500 mg total) by mouth 2 (two) times daily.   No facility-administered encounter medications on file as of 03/08/2023.    Past Medical History:  Diagnosis Date   Depression    Genital herpes     Past Surgical History:  Procedure Laterality Date   TONSILLECTOMY      Family History  Problem Relation Age of Onset   Diabetes Maternal Aunt    Diabetes Maternal Grandfather    Diverticulosis Mother     Social History   Socioeconomic History   Marital status: Single    Spouse name: Not on file   Number of children: Not on file   Years of education: Not on file   Highest education level: Not on file  Occupational History   Not on file  Tobacco Use   Smoking status: Every Day    Current packs/day: 0.00    Average packs/day: 0.3 packs/day for 4.0 years (1.0 ttl pk-yrs)    Types: Cigarettes    Start date: 11/30/2012    Last attempt to quit: 11/30/2016    Years since quitting: 6.2   Smokeless tobacco: Never  Substance and Sexual Activity   Alcohol use: Yes    Comment: occasion   Drug use: No   Sexual activity: Yes    Birth control/protection: None  Other Topics Concern   Not on file  Social History Narrative   Not on file   Social Determinants of Health   Financial Resource Strain: Low Risk  (03/08/2023)   Overall Financial Resource Strain (CARDIA)    Difficulty of Paying Living Expenses: Not hard at all  Food  Insecurity: No Food Insecurity (03/08/2023)   Hunger Vital Sign    Worried About Running Out of Food in the Last Year: Never true    Ran Out of Food in the Last Year: Never true  Transportation Needs: No Transportation Needs (03/08/2023)   PRAPARE - Administrator, Civil Service (Medical): No    Lack of Transportation (Non-Medical): No  Physical Activity: Sufficiently Active (03/08/2023)   Exercise Vital Sign    Days of Exercise per Week: 7 days    Minutes of Exercise per Session: 30 min  Stress: No Stress Concern Present (03/08/2023)   [employer] of Occupational Health - Occupational Stress Questionnaire    Feeling of Stress : Not at all  Social Connections: Socially Isolated (03/08/2023)   Social Connection and Isolation Panel [NHANES]    Frequency of Communication with Friends and Family: Twice a week    Frequency of Social Gatherings with Friends and Family: Once a week    Attends Religious Services: Never    Database administrator or Organizations: No    Attends Banker Meetings: Never    Marital Status: Never married  Intimate Partner Violence: Not At Risk (03/08/2023)   Humiliation, Afraid, Rape, and  Kick questionnaire    Fear of Current or Ex-Partner: No    Emotionally Abused: No    Physically Abused: No    Sexually Abused: No    Review of Systems  All other systems reviewed and are negative.       Objective    BP (!) 140/87 (BP Location: Right Arm, Patient Position: Sitting, Cuff Size: Large)   Pulse 93   Temp 98.3 F (36.8 C) (Oral)   Resp 16   Ht 5\' 3"  (1.6 m)   Wt 185 lb 9.6 oz (84.2 kg)   SpO2 96%   BMI 32.88 kg/m   Physical Exam Vitals and nursing note reviewed.  Constitutional:      General: She is not in acute distress. HENT:     Head: Normocephalic and atraumatic.     Right Ear: Tympanic membrane, ear canal and external ear normal.     Left Ear: Tympanic membrane, ear canal and external ear normal.     Nose: Nose normal.      Mouth/Throat:     Mouth: Mucous membranes are moist.     Pharynx: Oropharynx is clear.  Eyes:     Conjunctiva/sclera: Conjunctivae normal.     Pupils: Pupils are equal, round, and reactive to light.  Neck:     Thyroid: No thyromegaly.  Cardiovascular:     Rate and Rhythm: Normal rate and regular rhythm.     Heart sounds: Normal heart sounds. No murmur heard. Pulmonary:     Effort: Pulmonary effort is normal. No respiratory distress.     Breath sounds: Normal breath sounds.  Abdominal:     General: There is no distension.     Palpations: Abdomen is soft. There is no mass.     Tenderness: There is no abdominal tenderness.  Genitourinary:    Exam position: Supine.     Labia:        Right: No lesion.        Left: No lesion.      Vagina: Normal.     Cervix: Normal.     Uterus: Normal.      Adnexa: Right adnexa normal and left adnexa normal.     Rectum: Normal.  Musculoskeletal:        General: Normal range of motion.     Cervical back: Normal range of motion and neck supple.  Lymphadenopathy:     Lower Body: No right inguinal adenopathy. No left inguinal adenopathy.  Skin:    General: Skin is warm and dry.  Neurological:     General: No focal deficit present.     Mental Status: She is alert and oriented to person, place, and time.  Psychiatric:        Mood and Affect: Mood normal.        Behavior: Behavior normal.         Assessment & Plan:   Annual physical exam -     Comprehensive metabolic panel -     Cytology - PAP  Pap smear, as part of routine gynecological examination  Vitamin D deficiency -     VITAMIN D 25 Hydroxy (Vit-D Deficiency, Fractures)  Screening for deficiency anemia -     CBC with Differential/Platelet  Screening for lipid disorders -     Lipid panel     No follow-ups on file.   Tommie Raymond, MD

## 2023-03-09 NOTE — Telephone Encounter (Signed)
Copied from CRM [id number]. Topic: General - Other >> Mar 09, 2023  9:22 AM Franchot Heidelberg wrote: Mary from Cytology called reporting that they are missing an order for a swab, needs to speak to the clinic before they are able to process this.  [phone]

## 2023-03-11 ENCOUNTER — Other Ambulatory Visit: Payer: Self-pay

## 2023-03-11 NOTE — Telephone Encounter (Signed)
Please call and schedule VV with provider to talk about quit smoking

## 2023-03-12 ENCOUNTER — Other Ambulatory Visit: Payer: Self-pay | Admitting: *Deleted

## 2023-03-12 ENCOUNTER — Other Ambulatory Visit: Payer: Self-pay

## 2023-03-12 ENCOUNTER — Telehealth: Payer: Self-pay | Admitting: Family Medicine

## 2023-03-12 LAB — CYTOLOGY - PAP
Adequacy: ABSENT
Comment: NEGATIVE
Diagnosis: NEGATIVE
High risk HPV: NEGATIVE

## 2023-03-12 LAB — CERVICOVAGINAL ANCILLARY ONLY
Bacterial Vaginitis (gardnerella): POSITIVE — AB
Candida Glabrata: NEGATIVE
Candida Vaginitis: NEGATIVE
Chlamydia: NEGATIVE
Comment: NEGATIVE
Comment: NEGATIVE
Comment: NEGATIVE
Comment: NEGATIVE
Comment: NEGATIVE
Comment: NORMAL
Neisseria Gonorrhea: NEGATIVE
Trichomonas: NEGATIVE

## 2023-03-12 MED FILL — Valacyclovir HCl Tab 500 MG: ORAL | 45 days supply | Qty: 90 | Fill #0 | Status: AC

## 2023-03-12 NOTE — Telephone Encounter (Signed)
Prescription Request  03/12/2023  LOV: 03/08/2023  What is the name of the medication or equipment? Valtrex  Have you contacted your pharmacy to request a refill? No   Which pharmacy would you like this sent to?  [facility] [address] [place] [address] Phone: [phone] Fax: [phone]    Patient notified that their request is being sent to the clinical staff for review and that they should receive a response within 2 business days.   Please advise at Mobile [phone] (mobile)

## 2023-03-14 ENCOUNTER — Other Ambulatory Visit: Payer: Self-pay

## 2023-03-15 ENCOUNTER — Other Ambulatory Visit: Payer: Self-pay

## 2023-03-15 ENCOUNTER — Encounter: Payer: Self-pay | Admitting: Family Medicine

## 2023-03-15 ENCOUNTER — Other Ambulatory Visit: Payer: Self-pay | Admitting: Family Medicine

## 2023-03-15 MED ORDER — METRONIDAZOLE 500 MG PO TABS
500.0000 mg | ORAL_TABLET | Freq: Two times a day (BID) | ORAL | 0 refills | Status: DC
Start: 2023-03-15 — End: 2023-03-20
  Filled 2023-03-15: qty 14, 7d supply, fill #0

## 2023-03-20 ENCOUNTER — Other Ambulatory Visit: Payer: Self-pay | Admitting: Family Medicine

## 2023-03-20 ENCOUNTER — Telehealth (INDEPENDENT_AMBULATORY_CARE_PROVIDER_SITE_OTHER): Payer: 59 | Admitting: Family Medicine

## 2023-03-20 ENCOUNTER — Other Ambulatory Visit: Payer: Self-pay

## 2023-03-20 DIAGNOSIS — Z716 Tobacco abuse counseling: Secondary | ICD-10-CM

## 2023-03-20 MED ORDER — IRON (FERROUS SULFATE) 325 (65 FE) MG PO TABS
325.0000 mg | ORAL_TABLET | Freq: Every day | ORAL | 1 refills | Status: AC
  Filled 2023-03-20: qty 90, 90d supply, fill #0

## 2023-03-20 MED ORDER — METRONIDAZOLE 500 MG PO TABS
500.0000 mg | ORAL_TABLET | Freq: Two times a day (BID) | ORAL | 0 refills | Status: AC
  Filled 2023-03-20: qty 14, 7d supply, fill #0

## 2023-03-20 MED ORDER — VARENICLINE TARTRATE (STARTER) 0.5 MG X 11 & 1 MG X 42 PO TBPK
ORAL_TABLET | ORAL | 0 refills | Status: DC
  Filled 2023-03-20: qty 53, 30d supply, fill #0

## 2023-03-20 NOTE — Progress Notes (Unsigned)
Virtual Visit via Video Note  I connected with Karen Mcbride on 03/20/23 at  3:40 PM EDT by a video enabled telemedicine application and verified that I am speaking with the correct person using two identifiers.  Location: Patient: Karen Mcbride Provider: [place]   I discussed the limitations of evaluation and management by telemedicine and the availability of in person appointments. The patient expressed understanding and agreed to proceed.  History of Present Illness: Patient desires to stop smoking.    Observations/Objective:   Assessment and Plan: 1. Encounter for smoking cessation counseling Discussed options with patient. Patient chose chantix. Will monitor.    Follow Up Instructions:    I discussed the assessment and treatment plan with the patient. The patient was provided an opportunity to ask questions and all were answered. The patient agreed with the plan and demonstrated an understanding of the instructions.   The patient was advised to call back or seek an in-person evaluation if the symptoms worsen or if the condition fails to improve as anticipated.  I provided 7 minutes of non-face-to-face time during this encounter.   Karen Raymond, MD

## 2023-03-21 ENCOUNTER — Other Ambulatory Visit: Payer: Self-pay

## 2023-03-21 ENCOUNTER — Other Ambulatory Visit (HOSPITAL_COMMUNITY): Payer: Self-pay

## 2023-03-22 ENCOUNTER — Encounter: Payer: Self-pay | Admitting: Family Medicine

## 2023-05-09 ENCOUNTER — Encounter: Payer: Self-pay | Admitting: Family Medicine

## 2023-05-10 ENCOUNTER — Other Ambulatory Visit: Payer: Self-pay | Admitting: Family Medicine

## 2023-05-10 DIAGNOSIS — M79673 Pain in unspecified foot: Secondary | ICD-10-CM

## 2023-05-10 NOTE — Telephone Encounter (Signed)
Patient is  requesting a referral... see pictures

## 2023-05-15 ENCOUNTER — Telehealth: Payer: 59 | Admitting: Physician Assistant

## 2023-05-15 DIAGNOSIS — B3731 Acute candidiasis of vulva and vagina: Secondary | ICD-10-CM | POA: Diagnosis not present

## 2023-05-15 MED ORDER — FLUCONAZOLE 150 MG PO TABS
150.0000 mg | ORAL_TABLET | Freq: Every day | ORAL | 0 refills | Status: DC
Start: 2023-05-15 — End: 2023-05-22

## 2023-05-15 NOTE — Progress Notes (Signed)

## 2023-05-15 NOTE — Progress Notes (Signed)
I have spent 5 minutes in review of e-visit questionnaire, review and updating patient chart, medical decision making and response to patient.   Mia Milan Cody Jacklynn Dehaas, PA-C    

## 2023-05-22 ENCOUNTER — Other Ambulatory Visit: Payer: Self-pay

## 2023-05-22 ENCOUNTER — Ambulatory Visit (INDEPENDENT_AMBULATORY_CARE_PROVIDER_SITE_OTHER): Payer: 59

## 2023-05-22 ENCOUNTER — Encounter: Payer: Self-pay | Admitting: Podiatry

## 2023-05-22 ENCOUNTER — Other Ambulatory Visit (HOSPITAL_COMMUNITY): Payer: Self-pay

## 2023-05-22 ENCOUNTER — Ambulatory Visit (INDEPENDENT_AMBULATORY_CARE_PROVIDER_SITE_OTHER): Payer: 59 | Admitting: Podiatry

## 2023-05-22 VITALS — Ht 63.0 in | Wt 185.0 lb

## 2023-05-22 DIAGNOSIS — M778 Other enthesopathies, not elsewhere classified: Secondary | ICD-10-CM

## 2023-05-22 DIAGNOSIS — B351 Tinea unguium: Secondary | ICD-10-CM

## 2023-05-22 DIAGNOSIS — B353 Tinea pedis: Secondary | ICD-10-CM

## 2023-05-22 MED ORDER — TERBINAFINE HCL 250 MG PO TABS
250.0000 mg | ORAL_TABLET | Freq: Every day | ORAL | 0 refills | Status: AC
  Filled 2023-05-22: qty 90, 90d supply, fill #0

## 2023-05-22 MED ORDER — CLOTRIMAZOLE-BETAMETHASONE 1-0.05 % EX CREA
1.0000 | TOPICAL_CREAM | Freq: Two times a day (BID) | CUTANEOUS | 1 refills | Status: DC
  Filled 2023-05-22: qty 30, 15d supply, fill #0
  Filled 2023-06-17: qty 30, 15d supply, fill #1

## 2023-05-22 NOTE — Progress Notes (Signed)
  Subjective:  Patient ID: Karen Mcbride, female    DOB: 29-Aug-1990,  MRN: [id number]  Chief Complaint  Patient presents with   Foot Pain    Patient is here for left foot pain, and possible athletes foot, left foot only has treated it with medication for last year but is not going away. Foot is dry, peeling and blistered and tender to weight bearing activities.    Discussed the use of AI scribe software for clinical note transcription with the patient, who gave verbal consent to proceed.  History of Present Illness   The patient presents with a year-long history of a left foot rash, initially thought to be athlete's foot. The rash is described as dry and crusty, with darkening of the skin and occasional itching. The patient has also experienced periods of severe foot pain, to the point of being unable to walk, and thought it might be gout. The rash has been unresponsive to over-the-counter sprays. The patient denies any history of psoriasis or eczema. The patient also notes a change in the fifth toenail, which appears to be thickening and darkening.        Xray of left foot without acute osseous abnormalities  Objective:    Physical Exam   EXTREMITIES: Left foot warm and well perfused. SKIN: Fifth toenail on left foot mycotic and dystrophic. Peeling, scaling, and burning rash on plantar forefoot and midfoot arch. Minimal interdigital maceration.            Results          Assessment:   1. Capsulitis of left foot   2. Onychomycosis   3. Tinea pedis of both feet      Plan:  Patient was evaluated and treated and all questions answered.  Assessment and Plan    Tinea Pedis and Onychomycosis   She presents with a severe case of athlete's foot and associated nail fungus in the fifth toenail, persisting for a year with symptoms including itching, darkening of the skin, and occasional pain. There is no history of psoriasis, eczema, liver or kidney issues, or regular alcohol  consumption. We will start oral Lamisil (terbinafine) for three months to treat both the skin and nail fungus. Additionally, she will apply Lotrisone cream twice daily until the skin clears up and continue using the antifungal spray inside shoes and washing socks on hot. She should check for side effects such as nausea, diarrhea, or cramping; if these occur, she is to stop taking the medication and contact the office. A follow-up visit is scheduled in three months to assess progress.          Return in about 3 months (around 08/22/2023) for follow up after nail fungus treatment.

## 2023-05-22 NOTE — Patient Instructions (Signed)
VISIT SUMMARY:  You came in today because of a year-long rash on your left foot, which was initially thought to be athlete's foot. The rash has been dry, crusty, and occasionally itchy, with darkening of the skin and severe pain at times. You also noticed changes in your fifth toenail, which has become thick and dark. Over-the-counter treatments have not helped.  YOUR PLAN:  -TINEA PEDIS AND ONYCHOMYCOSIS: You have a severe case of athlete's foot and a fungal infection in your fifth toenail. Athlete's foot is a fungal infection that causes a rash, itching, and sometimes pain. The nail fungus causes thickening and darkening of the toenail. We are starting you on oral Lamisil (terbinafine) for three months to treat both the skin and nail fungus. You will also apply Lotrisone cream twice daily until the skin clears up and continue using the antifungal spray inside your shoes and wash your socks on hot. Watch for side effects like nausea, diarrhea, or cramping; if these occur, stop taking the medication and contact our office. We will see you again in three months to check your progress.  INSTRUCTIONS:  Please schedule a follow-up visit in three months to assess your progress. If you experience any side effects from the medication, such as nausea, diarrhea, or cramping, stop taking it and contact our office immediately.

## 2023-06-17 ENCOUNTER — Other Ambulatory Visit: Payer: Self-pay | Admitting: Family Medicine

## 2023-06-17 DIAGNOSIS — A6004 Herpesviral vulvovaginitis: Secondary | ICD-10-CM

## 2023-06-18 ENCOUNTER — Other Ambulatory Visit: Payer: Self-pay

## 2023-06-19 ENCOUNTER — Encounter: Payer: Self-pay | Admitting: Pharmacist

## 2023-06-19 ENCOUNTER — Other Ambulatory Visit: Payer: Self-pay

## 2023-06-19 ENCOUNTER — Other Ambulatory Visit (HOSPITAL_BASED_OUTPATIENT_CLINIC_OR_DEPARTMENT_OTHER): Payer: Self-pay

## 2023-06-19 ENCOUNTER — Other Ambulatory Visit (HOSPITAL_COMMUNITY): Payer: Self-pay

## 2023-06-22 ENCOUNTER — Other Ambulatory Visit: Payer: Self-pay

## 2023-08-21 ENCOUNTER — Encounter: Payer: Self-pay | Admitting: Family Medicine

## 2023-08-23 ENCOUNTER — Ambulatory Visit: Payer: 59 | Admitting: Podiatry

## 2023-08-31 ENCOUNTER — Other Ambulatory Visit: Payer: Self-pay | Admitting: Family Medicine

## 2023-08-31 DIAGNOSIS — A6004 Herpesviral vulvovaginitis: Secondary | ICD-10-CM

## 2023-09-02 ENCOUNTER — Other Ambulatory Visit: Payer: Self-pay | Admitting: Family Medicine

## 2023-09-02 ENCOUNTER — Other Ambulatory Visit: Payer: Self-pay

## 2023-09-02 DIAGNOSIS — A6004 Herpesviral vulvovaginitis: Secondary | ICD-10-CM

## 2023-09-03 ENCOUNTER — Other Ambulatory Visit: Payer: Self-pay

## 2023-09-03 ENCOUNTER — Other Ambulatory Visit (HOSPITAL_COMMUNITY): Payer: Self-pay

## 2023-09-04 ENCOUNTER — Other Ambulatory Visit: Payer: Self-pay | Admitting: Family Medicine

## 2023-09-04 ENCOUNTER — Other Ambulatory Visit: Payer: Self-pay

## 2023-09-06 ENCOUNTER — Other Ambulatory Visit: Payer: Self-pay

## 2023-09-10 ENCOUNTER — Other Ambulatory Visit (HOSPITAL_COMMUNITY): Payer: Self-pay

## 2023-09-10 ENCOUNTER — Other Ambulatory Visit: Payer: Self-pay

## 2023-09-10 ENCOUNTER — Encounter (HOSPITAL_COMMUNITY): Payer: Self-pay

## 2023-09-11 ENCOUNTER — Other Ambulatory Visit: Payer: Self-pay

## 2023-09-11 ENCOUNTER — Other Ambulatory Visit (HOSPITAL_COMMUNITY): Payer: Self-pay

## 2023-09-13 ENCOUNTER — Ambulatory Visit: Payer: 59 | Admitting: Podiatry

## 2023-09-25 ENCOUNTER — Encounter: Payer: Self-pay | Admitting: Family Medicine

## 2023-09-26 ENCOUNTER — Other Ambulatory Visit (HOSPITAL_COMMUNITY): Payer: Self-pay

## 2023-09-26 ENCOUNTER — Other Ambulatory Visit: Payer: Self-pay | Admitting: Family Medicine

## 2023-09-26 DIAGNOSIS — A6004 Herpesviral vulvovaginitis: Secondary | ICD-10-CM

## 2023-09-26 MED ORDER — VALACYCLOVIR HCL 500 MG PO TABS
500.0000 mg | ORAL_TABLET | Freq: Two times a day (BID) | ORAL | 0 refills | Status: DC
  Filled 2023-09-26: qty 30, 15d supply, fill #0

## 2023-11-30 ENCOUNTER — Other Ambulatory Visit: Payer: Self-pay | Admitting: Family Medicine

## 2023-11-30 ENCOUNTER — Other Ambulatory Visit (HOSPITAL_COMMUNITY): Payer: Self-pay

## 2023-11-30 ENCOUNTER — Telehealth: Admitting: Family Medicine

## 2023-11-30 DIAGNOSIS — J020 Streptococcal pharyngitis: Secondary | ICD-10-CM

## 2023-11-30 DIAGNOSIS — A6004 Herpesviral vulvovaginitis: Secondary | ICD-10-CM

## 2023-11-30 MED ORDER — AMOXICILLIN 875 MG PO TABS: 875.0000 mg | ORAL_TABLET | Freq: Two times a day (BID) | ORAL | 0 refills | Status: AC

## 2023-11-30 MED ORDER — VALACYCLOVIR HCL 500 MG PO TABS
500.0000 mg | ORAL_TABLET | Freq: Two times a day (BID) | ORAL | 0 refills | Status: DC
  Filled 2023-11-30 – 2024-01-18 (×2): qty 30, 15d supply, fill #0

## 2023-11-30 NOTE — Progress Notes (Signed)
 E-Visit for Sore Throat - Strep Symptoms  We are sorry that you are not feeling well.  Here is how we plan to help!  Based on what you have shared with me it is likely that you have strep pharyngitis.  Strep pharyngitis is inflammation and infection in the back of the throat.  This is an infection cause by bacteria and is treated with antibiotics.  I have prescribed Amoxicillin 500 mg twice a day for 10 days. For throat pain, we recommend over the counter oral pain relief medications such as acetaminophen or aspirin, or anti-inflammatory medications such as ibuprofen or naproxen sodium. Topical treatments such as oral throat lozenges or sprays may be used as needed. Strep infections are not as easily transmitted as other respiratory infections, however we still recommend that you avoid close contact with loved ones, especially the very young and elderly.  Remember to wash your hands thoroughly throughout the day as this is the number one way to prevent the spread of infection and wipe down door knobs and counters with disinfectant.   Home Care: Only take medications as instructed by your medical team. Complete the entire course of an antibiotic. Do not take these medications with alcohol. A steam or ultrasonic humidifier can help congestion.  You can place a towel over your head and breathe in the steam from hot water coming from a faucet. Avoid close contacts especially the very young and the elderly. Cover your mouth when you cough or sneeze. Always remember to wash your hands.  Get Help Right Away If: You develop worsening fever or sinus pain. You develop a severe head ache or visual changes. Your symptoms persist after you have completed your treatment plan.  Make sure you Understand these instructions. Will watch your condition. Will get help right away if you are not doing well or get worse.   Thank you for choosing an e-visit.  Your e-visit answers were reviewed by a board  certified advanced clinical practitioner to complete your personal care plan. Depending upon the condition, your plan could have included both over the counter or prescription medications.  Please review your pharmacy choice. Make sure the pharmacy is open so you can pick up prescription now. If there is a problem, you may contact your provider through [employer] and have the prescription routed to another pharmacy.  Your safety is important to [place]. If you have drug allergies check your prescription carefully.   For the next 24 hours you can use MyChart to ask questions about today's visit, request a non-urgent call back, or ask for a work or school excuse. You will get an email in the next two days asking about your experience. I hope that your e-visit has been valuable and will speed your recovery.    have provided 5 minutes of non face to face time during this encounter for chart review and documentation.

## 2023-12-12 ENCOUNTER — Other Ambulatory Visit (HOSPITAL_COMMUNITY): Payer: Self-pay

## 2024-01-18 ENCOUNTER — Other Ambulatory Visit: Payer: Self-pay

## 2024-01-18 ENCOUNTER — Other Ambulatory Visit (HOSPITAL_COMMUNITY): Payer: Self-pay

## 2024-02-14 ENCOUNTER — Telehealth: Admitting: Physician Assistant

## 2024-02-14 DIAGNOSIS — R6884 Jaw pain: Secondary | ICD-10-CM

## 2024-02-14 DIAGNOSIS — H9202 Otalgia, left ear: Secondary | ICD-10-CM

## 2024-02-14 NOTE — Progress Notes (Signed)
  Because of severity of pain and need to distinguish a true ear issue from TMJ dysfunction (the joint behind the ear where your jaw connects to the rest of your skull), I feel your condition warrants further evaluation and I recommend that you be seen in a face-to-face visit.   NOTE: There will be NO CHARGE for this E-Visit   If you are having a true medical emergency, please call 911.     For an urgent face to face visit, [facility] has multiple urgent care centers for your convenience.  Click the link below for the full list of locations and hours, walk-in wait times, appointment scheduling options and driving directions:  Urgent Care - [place], [place], [place], [place], [place], [place]  [place]     Your MyChart E-visit questionnaire answers were reviewed by a board certified advanced clinical practitioner to complete your personal care plan based on your specific symptoms.    Thank you for using e-Visits.

## 2024-02-15 ENCOUNTER — Ambulatory Visit
Admission: EM | Admit: 2024-02-15 | Discharge: 2024-02-15 | Disposition: A | Discharge status: Home / Self Care | Attending: Family Medicine | Admitting: Family Medicine

## 2024-02-15 ENCOUNTER — Other Ambulatory Visit (HOSPITAL_COMMUNITY): Payer: Self-pay

## 2024-02-15 DIAGNOSIS — H60392 Other infective otitis externa, left ear: Secondary | ICD-10-CM | POA: Diagnosis not present

## 2024-02-15 MED ORDER — IBUPROFEN 800 MG PO TABS
800.0000 mg | ORAL_TABLET | Freq: Three times a day (TID) | ORAL | 0 refills | Status: DC
Start: 2024-02-15 — End: 2024-02-18
  Filled 2024-02-15: qty 21, 7d supply, fill #0

## 2024-02-15 MED ORDER — CIPROFLOXACIN-DEXAMETHASONE 0.3-0.1 % OT SUSP
4.0000 [drp] | Freq: Two times a day (BID) | OTIC | 0 refills | Status: DC
  Filled 2024-02-15: qty 7.5, 19d supply, fill #0

## 2024-02-15 NOTE — ED Triage Notes (Signed)
 Pt reports pain and clogged left era and headache x 1 day. Tylenol  gives no relief.

## 2024-02-15 NOTE — ED Provider Notes (Signed)
 [facility]  Note:  This document was prepared using Conservation officer, historic buildings and may include unintentional dictation errors.  MRN: [id number] DOB: 1991/04/07  Subjective:   Karen Mcbride is a 33 y.o. female presenting for 1 day history of acute onset severe left ear pain that is radiating pain into her jaw bilaterally, causing headache.  No fever, runny or stuffy nose, sore throat.  No ear drainage.  No recent swimming.  No rashes.  No current facility-administered medications for this encounter.  Current Outpatient Medications:    clotrimazole -betamethasone  (LOTRISONE ) cream, Apply 1 Application topically 2 (two) times daily., Disp: 30 g, Rfl: 1   Iron , Ferrous Sulfate , 325 (65 Fe) MG TABS, Take 325 mg by mouth daily., Disp: 90 tablet, Rfl: 1   naproxen  (NAPROSYN ) 500 MG tablet, Take 1 tablet (500 mg total) by mouth 2 (two) times daily with a meal., Disp: 30 tablet, Rfl: 0   SUMAtriptan (IMITREX) 50 MG tablet, Take by mouth., Disp: , Rfl:    valACYclovir  (VALTREX ) 500 MG tablet, Take 1 tablet (500 mg total) by mouth 2 (two) times daily., Disp: 30 tablet, Rfl: 0   Varenicline  Tartrate, Starter, (CHANTIX  STARTING MONTH PAK) 0.5 MG X 11 & 1 MG X 42 TBPK, Take by mouth as directed, Disp: 53 each, Rfl: 0   Vitamin D , Ergocalciferol , (DRISDOL ) 1.25 MG (50000 UNIT) CAPS capsule, Take 1 capsule (50,000 Units total) by mouth every 7 (seven) days., Disp: 12 capsule, Rfl: 0   No Known Allergies  Past Medical History:  Diagnosis Date   Depression    Genital herpes      Past Surgical History:  Procedure Laterality Date   TONSILLECTOMY      Family History  Problem Relation Age of Onset   Diabetes Maternal Aunt    Diabetes Maternal Grandfather    Diverticulosis Mother     Social History   Tobacco Use   Smoking status: Every Day    Current packs/day: 0.00    Average packs/day: 0.3 packs/day for 4.0 years (1.0 ttl pk-yrs)    Types: Cigarettes     Start date: 11/30/2012    Last attempt to quit: 11/30/2016    Years since quitting: 7.2   Smokeless tobacco: Never  Substance Use Topics   Alcohol use: Yes    Comment: occasion   Drug use: No    ROS   Objective:   Vitals: BP (!) 140/97 (BP Location: Right Arm)   Pulse (!) 118   Temp 99.2 F (37.3 C) (Oral)   Resp 18   LMP  (Within Weeks) Comment: 1 week  SpO2 96%   Breastfeeding No   Physical Exam Constitutional:      General: She is not in acute distress.    Appearance: Normal appearance. She is well-developed. She is not ill-appearing, toxic-appearing or diaphoretic.  HENT:     Head: Normocephalic and atraumatic.     Right Ear: Tympanic membrane, ear canal and external ear normal. No tenderness. There is no impacted cerumen. Tympanic membrane is not injected, perforated, erythematous or bulging.     Left Ear: No tenderness. Tympanic membrane is not injected, perforated, erythematous or bulging.     Ears:     Comments: 2+ swelling of the external left ear canal with dried drainage distally.    Nose: Nose normal.     Mouth/Throat:     Mouth: Mucous membranes are moist.  Eyes:     General: No scleral icterus.  Right eye: No discharge.        Left eye: No discharge.     Extraocular Movements: Extraocular movements intact.  Cardiovascular:     Rate and Rhythm: Normal rate.  Pulmonary:     Effort: Pulmonary effort is normal.  Skin:    General: Skin is warm and dry.  Neurological:     General: No focal deficit present.     Mental Status: She is alert and oriented to person, place, and time.  Psychiatric:        Mood and Affect: Mood normal.        Behavior: Behavior normal.    An ear wick was placed into the left ear with alligator forceps.  I do not have any medicated eardrops in the clinic to administer.  Assessment and Plan :   PDMP not reviewed this encounter.  1. Infective otitis externa of left ear    Recommended managing with Ciprodex .  Ibuprofen   for pain and inflammation.  Follow-up with ENT urgently.  Maintain strict ER precautions.  Counseled patient on potential for adverse effects with medications prescribed/recommended today, ER and return-to-clinic precautions discussed, patient verbalized understanding.    Christopher Savannah, PA-C 02/15/24 1212

## 2024-02-18 ENCOUNTER — Encounter: Payer: Self-pay | Admitting: Family Medicine

## 2024-02-18 ENCOUNTER — Encounter (HOSPITAL_COMMUNITY): Payer: Self-pay

## 2024-02-18 ENCOUNTER — Other Ambulatory Visit (HOSPITAL_COMMUNITY): Payer: Self-pay

## 2024-02-18 ENCOUNTER — Ambulatory Visit (HOSPITAL_COMMUNITY)
Admission: RE | Admit: 2024-02-18 | Discharge: 2024-02-18 | Disposition: A | Discharge status: Home / Self Care | Source: Ambulatory Visit | Attending: Family Medicine | Admitting: Family Medicine

## 2024-02-18 VITALS — BP 132/85 | HR 94 | Temp 98.0°F | Resp 16

## 2024-02-18 DIAGNOSIS — H6023 Malignant otitis externa, bilateral: Secondary | ICD-10-CM

## 2024-02-18 MED ORDER — KETOROLAC TROMETHAMINE 30 MG/ML IJ SOLN
INTRAMUSCULAR | Status: AC
  Filled 2024-02-18: qty 1

## 2024-02-18 MED ORDER — KETOROLAC TROMETHAMINE 30 MG/ML IJ SOLN
30.0000 mg | Freq: Once | INTRAMUSCULAR | Status: AC
  Administered 2024-02-18: 30 mg via INTRAMUSCULAR

## 2024-02-18 MED ORDER — KETOROLAC TROMETHAMINE 10 MG PO TABS
10.0000 mg | ORAL_TABLET | Freq: Four times a day (QID) | ORAL | 0 refills | Status: AC | PRN
  Filled 2024-02-18: qty 20, 5d supply, fill #0

## 2024-02-18 MED ORDER — AMOXICILLIN-POT CLAVULANATE 875-125 MG PO TABS
1.0000 | ORAL_TABLET | Freq: Two times a day (BID) | ORAL | 0 refills | Status: AC
  Filled 2024-02-18: qty 14, 7d supply, fill #0

## 2024-02-18 NOTE — ED Provider Notes (Signed)
 [facility]    CSN: [id number] Arrival date & time: 02/18/24  1024      History   Chief Complaint Chief Complaint  Patient presents with   Otalgia    HPI Jaziah  L Supak is a 33 y.o. female.    Otalgia  For bilateral ear pain and no pain and swelling in her jaw  On August 14 she started having left ear pain radiating into her left jaw.  She was seen at another location on August 15 and Ciprodex  was prescribed and a ear wick was placed since the ear canal was swollen. She states that it does not seem that the eardrops have been going in and they just still pull at the outer ear.  She is now also having pain in her right ear and into her right jaw.  The pain has worsened in the left side also.  No fever or chills and no cough or congestion.  She has had an occasional sore throat.  NKDA  Last menstrual cycle August 10.   Past Medical History:  Diagnosis Date   Depression    Genital herpes     Patient Active Problem List   Diagnosis Date Noted   Trichomonas vaginalis (TV) infection 02/03/2022   Postpartum hemorrhage 02/27/2017   Shoulder dystocia during labor and delivery 02/27/2017   Low vitamin D  level 09/28/2016   Genital herpes simplex 09/21/2016    Past Surgical History:  Procedure Laterality Date   TONSILLECTOMY      OB History     Gravida  2   Para  2   Term  2   Preterm      AB      Living  2      SAB      IAB      Ectopic      Multiple  0   Live Births  2            Home Medications    Prior to Admission medications   Medication Sig Start Date End Date Taking? Authorizing Provider  amoxicillin -clavulanate (AUGMENTIN ) 875-125 MG tablet Take 1 tablet by mouth 2 (two) times daily for 7 days. 02/18/24 02/25/24 Yes Vonna Sharlet POUR, MD  ketorolac  (TORADOL ) 10 MG tablet Take 1 tablet (10 mg total) by mouth every 6 (six) hours as needed (pain). 02/18/24  Yes Kemaria Dedic, Sharlet POUR, MD  Iron , Ferrous Sulfate , 325 (65 Fe)  MG TABS Take 325 mg by mouth daily. 03/20/23   Tanda Bleacher, MD  SUMAtriptan (IMITREX) 50 MG tablet Take by mouth. 02/01/23   [provider]  valACYclovir  (VALTREX ) 500 MG tablet Take 1 tablet (500 mg total) by mouth 2 (two) times daily. 11/30/23   Tanda Bleacher, MD  Vitamin D , Ergocalciferol , (DRISDOL ) 1.25 MG (50000 UNIT) CAPS capsule Take 1 capsule (50,000 Units total) by mouth every 7 (seven) days. 03/09/23   Tanda Bleacher, MD    Family History Family History  Problem Relation Age of Onset   Diabetes Maternal Aunt    Diabetes Maternal Grandfather    Diverticulosis Mother     Social History Social History   Tobacco Use   Smoking status: Every Day    Current packs/day: 0.00    Average packs/day: 0.3 packs/day for 4.0 years (1.0 ttl pk-yrs)    Types: Cigarettes    Start date: 11/30/2012    Last attempt to quit: 11/30/2016    Years since quitting: 7.2   Smokeless tobacco: Never  Substance Use Topics   Alcohol use: Yes    Comment: occasion   Drug use: No     Allergies   Patient has no known allergies.   Review of Systems Review of Systems  HENT:  Positive for ear pain.      Physical Exam Triage Vital Signs ED Triage Vitals  Encounter Vitals Group     BP 02/18/24 1056 132/85     Girls Systolic BP Percentile --      Girls Diastolic BP Percentile --      Boys Systolic BP Percentile --      Boys Diastolic BP Percentile --      Pulse Rate 02/18/24 1056 94     Resp 02/18/24 1056 16     Temp 02/18/24 1056 98 F (36.7 C)     Temp Source 02/18/24 1056 Oral     SpO2 02/18/24 1056 95 %     Weight --      Height --      Head Circumference --      Peak Flow --      Pain Score 02/18/24 1057 10     Pain Loc --      Pain Education --      Exclude from Growth Chart --    No data found.  Updated Vital Signs BP 132/85 (BP Location: Right Arm)   Pulse 94   Temp 98 F (36.7 C) (Oral)   Resp 16   LMP 02/10/2024 (Approximate)   SpO2 95%   Visual  Acuity Right Eye Distance:   Left Eye Distance:   Bilateral Distance:    Right Eye Near:   Left Eye Near:    Bilateral Near:     Physical Exam Vitals reviewed.  Constitutional:      General: She is not in acute distress.    Appearance: She is not ill-appearing, toxic-appearing or diaphoretic.  HENT:     Ears:     Comments: There is tenderness on traction of the pinna bilaterally, and there is tenderness and puffiness anterior and posterior to her pinna bilaterally.  Both ear canals are swollen.  There is some discharge adherent to the walls bilaterally.  I remove the ear wick from the left ear.  Tympanic membrane's are not visible due to the swelling of the canal walls    Mouth/Throat:     Mouth: Mucous membranes are moist.     Comments: Oropharynx benign Eyes:     Extraocular Movements: Extraocular movements intact.     Conjunctiva/sclera: Conjunctivae normal.     Pupils: Pupils are equal, round, and reactive to light.  Cardiovascular:     Rate and Rhythm: Normal rate and regular rhythm.     Heart sounds: No murmur heard. Pulmonary:     Effort: Pulmonary effort is normal.     Breath sounds: Normal breath sounds.  Musculoskeletal:     Cervical back: Neck supple.  Lymphadenopathy:     Cervical: No cervical adenopathy.  Skin:    Coloration: Skin is not pale.  Neurological:     General: No focal deficit present.     Mental Status: She is alert and oriented to person, place, and time.  Psychiatric:        Behavior: Behavior normal.      UC Treatments / Results  Labs (all labs ordered are listed, but only abnormal results are displayed) Labs Reviewed - No data to display  EKG   Radiology No results found.  Procedures Procedures (including critical care time)  Medications Ordered in UC Medications  ketorolac  (TORADOL ) 30 MG/ML injection 30 mg (has no administration in time range)    Initial Impression / Assessment and Plan / UC Course  I have reviewed the  triage vital signs and the nursing notes.  Pertinent labs & imaging results that were available during my care of the patient were reviewed by me and considered in my medical decision making (see chart for details).     So far her lab work has shown normal glucose, reviewed in epic from 2024.  Last EGFR was normal at 113.  Toradol  injection was given here for her pain and Toradol  tablets are sent to the pharmacy along with Augmentin  for diffuse otitis externa with cellulitis in the surrounding tissues. Final Clinical Impressions(s) / UC Diagnoses   Final diagnoses:  Acute malignant otitis externa of both ears     Discharge Instructions      You have been given a shot of Toradol  30 mg today.  Ketorolac  10 mg tablets--take 1 tablet every 6 hours as needed for pain.  This is the same medicine that is in the shot we just gave you  Take amoxicillin -clavulanate 875 mg--1 tab twice daily with food for 7 days    .     ED Prescriptions     Medication Sig Dispense Auth. Provider   amoxicillin -clavulanate (AUGMENTIN ) 875-125 MG tablet Take 1 tablet by mouth 2 (two) times daily for 7 days. 14 tablet Brunette Lavalle K, MD   ketorolac  (TORADOL ) 10 MG tablet Take 1 tablet (10 mg total) by mouth every 6 (six) hours as needed (pain). 20 tablet Jadia Capers K, MD      PDMP not reviewed this encounter.   Vonna Sharlet POUR, MD 02/18/24 [phone]

## 2024-02-18 NOTE — ED Triage Notes (Addendum)
 Patient here today with c/o left side ear pain since Thursday. Patient went to urgent care and was told that she had an infection. Patient has been using the ear drops but does not feel like it goes inside. Patient has also been taking Ibuprofen  with no relief. Patient has now started having some pain in her right ear since Saturday night. Patient's PCP had sent in Amoxicillin  on 02/14/2024 but patient has not been taking it.

## 2024-02-18 NOTE — Discharge Instructions (Addendum)
 You have been given a shot of Toradol 30 mg today.  Ketorolac 10 mg tablets--take 1 tablet every 6 hours as needed for pain.  This is the same medicine that is in the shot we just gave you  Take amoxicillin-clavulanate 875 mg--1 tab twice daily with food for 7 days

## 2024-02-20 ENCOUNTER — Emergency Department (HOSPITAL_COMMUNITY)
Admission: EM | Admit: 2024-02-20 | Discharge: 2024-02-20 | Disposition: A | Discharge status: Home / Self Care | Attending: Emergency Medicine | Admitting: Emergency Medicine

## 2024-02-20 ENCOUNTER — Telehealth: Payer: Self-pay | Admitting: Family Medicine

## 2024-02-20 ENCOUNTER — Other Ambulatory Visit: Payer: Self-pay

## 2024-02-20 ENCOUNTER — Other Ambulatory Visit (HOSPITAL_COMMUNITY): Payer: Self-pay

## 2024-02-20 ENCOUNTER — Encounter (HOSPITAL_COMMUNITY): Payer: Self-pay

## 2024-02-20 ENCOUNTER — Emergency Department (HOSPITAL_COMMUNITY)

## 2024-02-20 DIAGNOSIS — F17210 Nicotine dependence, cigarettes, uncomplicated: Secondary | ICD-10-CM | POA: Diagnosis not present

## 2024-02-20 DIAGNOSIS — H60503 Unspecified acute noninfective otitis externa, bilateral: Secondary | ICD-10-CM | POA: Insufficient documentation

## 2024-02-20 DIAGNOSIS — H7090 Unspecified mastoiditis, unspecified ear: Secondary | ICD-10-CM | POA: Diagnosis not present

## 2024-02-20 DIAGNOSIS — R590 Localized enlarged lymph nodes: Secondary | ICD-10-CM | POA: Diagnosis not present

## 2024-02-20 DIAGNOSIS — H9203 Otalgia, bilateral: Secondary | ICD-10-CM | POA: Diagnosis not present

## 2024-02-20 DIAGNOSIS — H60501 Unspecified acute noninfective otitis externa, right ear: Secondary | ICD-10-CM | POA: Diagnosis not present

## 2024-02-20 LAB — BASIC METABOLIC PANEL WITH GFR
Anion gap: 11 (ref 5–15)
BUN: 12 mg/dL (ref 6–20)
CO2: 25 mmol/L (ref 22–32)
Calcium: 8.9 mg/dL (ref 8.9–10.3)
Chloride: 99 mmol/L (ref 98–111)
Creatinine, Ser: 0.77 mg/dL (ref 0.44–1.00)
GFR, Estimated: >60 mL/min (ref >60)
Glucose, Bld: 120 mg/dL — ABNORMAL HIGH (ref 70–99)
Potassium: 3.5 mmol/L (ref 3.5–5.1)
Sodium: 135 mmol/L (ref 135–145)

## 2024-02-20 LAB — CBC
HCT: 34.7 % — ABNORMAL LOW (ref 36.0–46.0)
Hemoglobin: 11.4 g/dL — ABNORMAL LOW (ref 12.0–15.0)
MCH: 29.3 pg (ref 26.0–34.0)
MCHC: 32.9 g/dL (ref 30.0–36.0)
MCV: 89.2 fL (ref 80.0–100.0)
Platelets: 230 K/uL (ref 150–400)
RBC: 3.89 MIL/uL (ref 3.87–5.11)
RDW: 13.9 % (ref 11.5–15.5)
WBC: 6.4 K/uL (ref 4.0–10.5)
nRBC: 0 % (ref 0.0–0.2)

## 2024-02-20 LAB — HCG, SERUM, QUALITATIVE: Preg, Serum: NEGATIVE

## 2024-02-20 MED ORDER — OFLOXACIN 0.3 % OT SOLN
10.0000 [drp] | Freq: Every day | OTIC | 0 refills | Status: AC
  Filled 2024-02-20: qty 10, 10d supply, fill #0

## 2024-02-20 MED ORDER — MORPHINE SULFATE (PF) 4 MG/ML IV SOLN
4.0000 mg | Freq: Once | INTRAVENOUS | Status: AC
  Administered 2024-02-20: 4 mg via INTRAVENOUS
  Filled 2024-02-20: qty 1

## 2024-02-20 MED ORDER — PREDNISONE 20 MG PO TABS
40.0000 mg | ORAL_TABLET | Freq: Every day | ORAL | 0 refills | Status: AC
  Filled 2024-02-20: qty 10, 5d supply, fill #0

## 2024-02-20 MED ORDER — KETOROLAC TROMETHAMINE 15 MG/ML IJ SOLN
15.0000 mg | Freq: Once | INTRAMUSCULAR | Status: AC
  Administered 2024-02-20: 15 mg via INTRAVENOUS
  Filled 2024-02-20: qty 1

## 2024-02-20 MED ORDER — IOHEXOL 300 MG/ML  SOLN
100.0000 mL | Freq: Once | INTRAMUSCULAR | Status: AC | PRN
  Administered 2024-02-20: 75 mL via INTRAVENOUS

## 2024-02-20 MED ORDER — DEXAMETHASONE SODIUM PHOSPHATE 10 MG/ML IJ SOLN
10.0000 mg | Freq: Once | INTRAMUSCULAR | Status: AC
  Administered 2024-02-20: 10 mg via INTRAVENOUS
  Filled 2024-02-20: qty 1

## 2024-02-20 NOTE — ED Provider Notes (Signed)
 [facility] Emergency Department Provider Note MRN:  [id number]  Arrival date & time: 02/20/24     Chief Complaint   Ear Pain   History of Present Illness   Karen Mcbride is a 33 y.o. year-old female with no pertinent past medical history presenting to the ED with chief complaint of ear pain.  Bilateral ear pain for the past several days, started on the left and now it is also on the right.  Was on eardrops that did not help, briefly had an ear wick, more recently was put on Augmentin , not getting better, getting frustrated.  Denies fever.  Review of Systems  A thorough review of systems was obtained and all systems are negative except as noted in the HPI and PMH.   Patient's Health History    Past Medical History:  Diagnosis Date   Depression    Genital herpes     Past Surgical History:  Procedure Laterality Date   TONSILLECTOMY      Family History  Problem Relation Age of Onset   Diabetes Maternal Aunt    Diabetes Maternal Grandfather    Diverticulosis Mother     Social History   Socioeconomic History   Marital status: Single    Spouse name: Not on file   Number of children: Not on file   Years of education: Not on file   Highest education level: Not on file  Occupational History   Not on file  Tobacco Use   Smoking status: Every Day    Current packs/day: 0.00    Average packs/day: 0.3 packs/day for 4.0 years (1.0 ttl pk-yrs)    Types: Cigarettes    Start date: 11/30/2012    Last attempt to quit: 11/30/2016    Years since quitting: 7.2   Smokeless tobacco: Never  Substance and Sexual Activity   Alcohol use: Yes    Comment: occasion   Drug use: No   Sexual activity: Yes    Birth control/protection: None  Other Topics Concern   Not on file  Social History Narrative   Not on file   Social Drivers of Health   Financial Resource Strain: Low Risk  (03/08/2023)   Overall Financial Resource Strain (CARDIA)    Difficulty of Paying  Living Expenses: Not hard at all  Food Insecurity: No Food Insecurity (03/08/2023)   Hunger Vital Sign    Worried About Running Out of Food in the Last Year: Never true    Ran Out of Food in the Last Year: Never true  Transportation Needs: No Transportation Needs (03/08/2023)   PRAPARE - Administrator, Civil Service (Medical): No    Lack of Transportation (Non-Medical): No  Physical Activity: Sufficiently Active (03/08/2023)   Exercise Vital Sign    Days of Exercise per Week: 7 days    Minutes of Exercise per Session: 30 min  Stress: No Stress Concern Present (03/08/2023)   [employer] of Occupational Health - Occupational Stress Questionnaire    Feeling of Stress : Not at all  Social Connections: Socially Isolated (03/08/2023)   Social Connection and Isolation Panel    Frequency of Communication with Friends and Family: Twice a week    Frequency of Social Gatherings with Friends and Family: Once a week    Attends Religious Services: Never    Database administrator or Organizations: No    Attends Banker Meetings: Never    Marital Status: Never married  Intimate Partner Violence:  Not At Risk (03/08/2023)   Humiliation, Afraid, Rape, and Kick questionnaire    Fear of Current or Ex-Partner: No    Emotionally Abused: No    Physically Abused: No    Sexually Abused: No     Physical Exam   Vitals:   02/20/24 0203 02/20/24 0520  BP: (!) 156/93 (!) 130/95  Pulse: 100 77  Resp: 18 18  Temp: 98.9 F (37.2 C) 98.7 F (37.1 C)  SpO2: 95% 99%    CONSTITUTIONAL: Well-appearing, NAD NEURO/PSYCH:  Alert and oriented x 3, no focal deficits EYES:  eyes equal and reactive ENT/NECK:  no LAD, no JVD CARDIO: Regular rate, well-perfused, normal S1 and S2 PULM:  CTAB no wheezing or rhonchi GI/GU:  non-distended, non-tender MSK/SPINE:  No gross deformities, no edema SKIN:  no rash, atraumatic   *Additional and/or pertinent findings included in MDM below  Diagnostic  and Interventional Summary    EKG Interpretation Date/Time:    Ventricular Rate:    PR Interval:    QRS Duration:    QT Interval:    QTC Calculation:   R Axis:      Text Interpretation:         Labs Reviewed  CBC - Abnormal; Notable for the following components:      Result Value   Hemoglobin 11.4 (*)    HCT 34.7 (*)    All other components within normal limits  BASIC METABOLIC PANEL WITH GFR - Abnormal; Notable for the following components:   Glucose, Bld 120 (*)    All other components within normal limits  HCG, SERUM, QUALITATIVE    CT Temporal Bones W Contrast  Final Result      Medications  dexamethasone  (DECADRON ) injection 10 mg (has no administration in time range)  morphine  (PF) 4 MG/ML injection 4 mg (has no administration in time range)  morphine  (PF) 4 MG/ML injection 4 mg (4 mg Intravenous Given 02/20/24 0309)  ketorolac  (TORADOL ) 15 MG/ML injection 15 mg (15 mg Intravenous Given 02/20/24 0308)  iohexol  (OMNIPAQUE ) 300 MG/ML solution 100 mL (75 mLs Intravenous Contrast Given 02/20/24 0416)     Procedures  /  Critical Care Procedures  ED Course and Medical Decision Making  Initial Impression and Ddx Patient's ear canals are edematous and occluded by purulent type fluid.  She does have some tenderness to the mastoids and so mastoiditis is considered, obtaining CT imaging.  Vitals are reassuring.  Past medical/surgical history that increases complexity of ED encounter: None  Interpretation of Diagnostics I personally reviewed the Laboratory Testing and my interpretation is as follows: No significant blood count or electrolyte disturbance.  CT largely unremarkable, evidence to suggest otitis externa but no signs of mastoiditis  Patient Reassessment and Ultimate Disposition/Management     Patient seems to be doing better on reassessment, appropriate for discharge.  Patient management required discussion with the following services or consulting groups:   None  Complexity of Problems Addressed Acute illness or injury that poses threat of life of bodily function  Additional Data Reviewed and Analyzed Further history obtained from: None  Additional Factors Impacting ED Encounter Risk Prescriptions and Use of parenteral controlled substances  Nadalyn Deringer M. Theadore, MD [facility] Emergency Medicine [facility] [email or web address]  Final Clinical Impressions(s) / ED Diagnoses     ICD-10-CM   1. Acute otitis externa of both ears, unspecified type  H60.503       ED Discharge Orders  Ordered    ofloxacin  (FLOXIN ) 0.3 % OTIC solution  Daily        02/20/24 0521    predniSONE  (DELTASONE ) 20 MG tablet  Daily        02/20/24 0521             Discharge Instructions Discussed with and Provided to Patient:    Discharge Instructions      You were evaluated in the Emergency Department and after careful evaluation, we did not find any emergent condition requiring admission or further testing in the hospital.  Your exam/testing today is overall reassuring.  Symptoms seem to be due to otitis externa.  Continue the Augmentin  antibiotic pills as prescribed.  Recommend use of the ofloxacin  eardrops daily as prescribed.  Use the prednisone  steroid medication daily starting morning of 8-21.  Follow-up with ENT if you are not improving over the next few days.  Please return to the Emergency Department if you experience any worsening of your condition.   Thank you for allowing us  to be a part of your care.      Theadore Ozell HERO, MD 02/20/24 [phone]

## 2024-02-20 NOTE — Discharge Instructions (Addendum)
 You were evaluated in the Emergency Department and after careful evaluation, we did not find any emergent condition requiring admission or further testing in the hospital.  Your exam/testing today is overall reassuring.  Symptoms seem to be due to otitis externa.  Continue the Augmentin  antibiotic pills as prescribed.  Recommend use of the ofloxacin  eardrops daily as prescribed.  Use the prednisone  steroid medication daily starting morning of 8-21.  Follow-up with ENT if you are not improving over the next few days.  Please return to the Emergency Department if you experience any worsening of your condition.   Thank you for allowing us  to be a part of your care.

## 2024-02-20 NOTE — Telephone Encounter (Signed)
 Patient has visit 8/29. Will be filled out by Dr. Tanda then.

## 2024-02-20 NOTE — ED Triage Notes (Signed)
 Ongoing bilateral ear pain.   Recently diagnosed with ear infections and taking Augmentin .   Says the pressure is becoming unbearable.

## 2024-02-20 NOTE — Telephone Encounter (Signed)
 Pt scheduled

## 2024-02-20 NOTE — Telephone Encounter (Signed)
 A document form has been faxed: FMLA, to be filled out by provider. Send document back via Fax within 7-days. Document is located in providers tray at front office.          Fax number:  [phone]

## 2024-02-21 ENCOUNTER — Encounter (INDEPENDENT_AMBULATORY_CARE_PROVIDER_SITE_OTHER): Payer: Self-pay

## 2024-02-21 ENCOUNTER — Ambulatory Visit (INDEPENDENT_AMBULATORY_CARE_PROVIDER_SITE_OTHER): Admitting: Family Medicine

## 2024-02-21 ENCOUNTER — Encounter: Payer: Self-pay | Admitting: Family Medicine

## 2024-02-21 VITALS — BP 136/89 | HR 86 | Ht 63.0 in | Wt 185.4 lb

## 2024-02-21 DIAGNOSIS — H9203 Otalgia, bilateral: Secondary | ICD-10-CM | POA: Diagnosis not present

## 2024-02-21 DIAGNOSIS — Z0289 Encounter for other administrative examinations: Secondary | ICD-10-CM | POA: Diagnosis not present

## 2024-02-21 DIAGNOSIS — H9193 Unspecified hearing loss, bilateral: Secondary | ICD-10-CM | POA: Diagnosis not present

## 2024-02-21 NOTE — Progress Notes (Signed)
 Established Patient Office Visit  Subjective    Patient ID: Karen Mcbride, female    DOB: Sep 29, 1990  Age: 33 y.o. MRN: [id number]  CC:  Chief Complaint  Patient presents with   Ear Pain    Bilateral ear pain. Has gone to urgent care and ER due to ear infection/pain/swelling.   Also needs fmla paperwork filled out    HPI Karen Mcbride presents for complaint of bilateral ear pain with reduced hearing for about 1 week. Symptoms happened acutely. Patient denies precedent viral symptoms or fever/chills. Patient has been seen in UC times 2 and ED times one for symptoms. Patient was given oral and topical antibiotic with minimal relief. Patient has been taking toradol  for pain. Patient also requests that FMLA form be completed.   Outpatient Encounter Medications as of 02/21/2024  Medication Sig   amoxicillin -clavulanate (AUGMENTIN ) 875-125 MG tablet Take 1 tablet by mouth 2 (two) times daily for 7 days.   Iron , Ferrous Sulfate , 325 (65 Fe) MG TABS Take 325 mg by mouth daily.   ketorolac  (TORADOL ) 10 MG tablet Take 1 tablet (10 mg total) by mouth every 6 (six) hours as needed (pain).   ofloxacin  (FLOXIN ) 0.3 % OTIC solution Place 10 drops into both ears daily for 7 days.   predniSONE  (DELTASONE ) 20 MG tablet Take 2 tablets (40 mg total) by mouth daily for 5 days.   SUMAtriptan (IMITREX) 50 MG tablet Take by mouth.   valACYclovir  (VALTREX ) 500 MG tablet Take 1 tablet (500 mg total) by mouth 2 (two) times daily.   Vitamin D , Ergocalciferol , (DRISDOL ) 1.25 MG (50000 UNIT) CAPS capsule Take 1 capsule (50,000 Units total) by mouth every 7 (seven) days.   No facility-administered encounter medications on file as of 02/21/2024.    Past Medical History:  Diagnosis Date   Depression    Genital herpes     Past Surgical History:  Procedure Laterality Date   TONSILLECTOMY      Family History  Problem Relation Age of Onset   Diabetes Maternal Aunt    Diabetes Maternal Grandfather     Diverticulosis Mother     Social History   Socioeconomic History   Marital status: Single    Spouse name: Not on file   Number of children: Not on file   Years of education: Not on file   Highest education level: 12th grade  Occupational History   Not on file  Tobacco Use   Smoking status: Every Day    Current packs/day: 0.00    Average packs/day: 0.3 packs/day for 4.0 years (1.0 ttl pk-yrs)    Types: Cigarettes    Start date: 11/30/2012    Last attempt to quit: 11/30/2016    Years since quitting: 7.2   Smokeless tobacco: Never  Substance and Sexual Activity   Alcohol use: Yes    Comment: occasion   Drug use: No   Sexual activity: Yes    Birth control/protection: None  Other Topics Concern   Not on file  Social History Narrative   Not on file   Social Drivers of Health   Financial Resource Strain: Medium Risk (02/20/2024)   Overall Financial Resource Strain (CARDIA)    Difficulty of Paying Living Expenses: Somewhat hard  Food Insecurity: Food Insecurity Present (02/20/2024)   Hunger Vital Sign    Worried About Running Out of Food in the Last Year: Sometimes true    Ran Out of Food in the Last Year: Sometimes true  Transportation  Needs: No Transportation Needs (02/20/2024)   PRAPARE - Administrator, Civil Service (Medical): No    Lack of Transportation (Non-Medical): No  Physical Activity: Insufficiently Active (02/20/2024)   Exercise Vital Sign    Days of Exercise per Week: 5 days    Minutes of Exercise per Session: 20 min  Stress: No Stress Concern Present (02/20/2024)   [employer] of Occupational Health - Occupational Stress Questionnaire    Feeling of Stress: Only a little  Social Connections: Unknown (02/20/2024)   Social Connection and Isolation Panel    Frequency of Communication with Friends and Family: Twice a week    Frequency of Social Gatherings with Friends and Family: Patient declined    Attends Religious Services: Patient declined     Database administrator or Organizations: No    Attends Engineer, structural: Not on file    Marital Status: Never married  Intimate Partner Violence: Not At Risk (03/08/2023)   Humiliation, Afraid, Rape, and Kick questionnaire    Fear of Current or Ex-Partner: No    Emotionally Abused: No    Physically Abused: No    Sexually Abused: No    Review of Systems  Constitutional:  Negative for fever.  HENT:  Positive for ear pain and hearing loss.   All other systems reviewed and are negative.       Objective    BP 136/89   Pulse 86   Ht 5' 3 (1.6 m)   Wt 185 lb 6.4 oz (84.1 kg)   LMP 02/10/2024 (Approximate) Comment: negative HCG 02/20/24  SpO2 92%   BMI 32.84 kg/m   Physical Exam Vitals and nursing note reviewed.  Constitutional:      General: She is not in acute distress. HENT:     Right Ear: Decreased hearing noted. Tenderness present.     Left Ear: Decreased hearing noted. Tenderness present.     Ears:     Comments: Bilateral swollen canals with minimal cerumen noted.  Cardiovascular:     Rate and Rhythm: Normal rate and regular rhythm.  Pulmonary:     Effort: Pulmonary effort is normal.     Breath sounds: Normal breath sounds.  Neurological:     General: No focal deficit present.     Mental Status: She is alert and oriented to person, place, and time.         Assessment & Plan:  1. Otalgia of both ears (Primary)  - Ambulatory referral to ENT  2. Decreased hearing of both ears  - Ambulatory referral to ENT  3. Encounter for completion of form with patient FMLA form completed    Return if symptoms worsen or fail to improve.   Tanda Raguel SQUIBB, MD

## 2024-02-22 ENCOUNTER — Telehealth: Payer: Self-pay

## 2024-02-22 NOTE — Telephone Encounter (Signed)
 Copied from CRM #[id number]. Topic: Referral - Question >> Feb 22, 2024  2:18 PM Karen Mcbride wrote: Reason for CRM: Pt calling in to let us  know she called the referred to ENT and they are booked until October. She says Tanda was suggesting she be seen by this weekend or early next week, so she wants to know what she suggests.

## 2024-02-25 ENCOUNTER — Telehealth: Payer: Self-pay | Admitting: Family Medicine

## 2024-02-25 NOTE — Telephone Encounter (Signed)
 Placed in Dr. Luigi folder for review

## 2024-02-25 NOTE — Telephone Encounter (Signed)
 A document form has been faxed: FMLA, to be filled out by provider. Send document back via Fax within 7-days. Document is located in providers tray at front office.          Fax number:  [phone]

## 2024-02-27 ENCOUNTER — Other Ambulatory Visit (HOSPITAL_COMMUNITY): Payer: Self-pay

## 2024-02-27 DIAGNOSIS — H6123 Impacted cerumen, bilateral: Secondary | ICD-10-CM | POA: Diagnosis not present

## 2024-02-27 DIAGNOSIS — H60333 Swimmer's ear, bilateral: Secondary | ICD-10-CM | POA: Diagnosis not present

## 2024-02-27 MED ORDER — PREDNISONE 10 MG (21) PO TBPK
ORAL_TABLET | ORAL | 0 refills | Status: AC
  Filled 2024-02-27: qty 21, 6d supply, fill #0

## 2024-02-27 MED ORDER — CIPROFLOXACIN-DEXAMETHASONE 0.3-0.1 % OT SUSP
4.0000 [drp] | Freq: Two times a day (BID) | OTIC | 10 refills | Status: AC
  Filled 2024-02-27: qty 7.5, 10d supply, fill #0
  Filled 2024-03-24: qty 7.5, 10d supply, fill #1

## 2024-02-27 MED ORDER — CIPROFLOXACIN HCL 500 MG PO TABS
500.0000 mg | ORAL_TABLET | Freq: Two times a day (BID) | ORAL | 0 refills | Status: AC
  Filled 2024-02-27: qty 28, 14d supply, fill #0

## 2024-02-29 ENCOUNTER — Encounter: Payer: Self-pay | Admitting: Family Medicine

## 2024-02-29 ENCOUNTER — Ambulatory Visit (INDEPENDENT_AMBULATORY_CARE_PROVIDER_SITE_OTHER): Admitting: Family Medicine

## 2024-02-29 ENCOUNTER — Encounter: Payer: 59 | Admitting: Family Medicine

## 2024-02-29 ENCOUNTER — Other Ambulatory Visit (HOSPITAL_COMMUNITY): Payer: Self-pay

## 2024-02-29 VITALS — BP 133/89 | HR 84 | Ht 63.0 in | Wt 183.4 lb

## 2024-02-29 DIAGNOSIS — A6004 Herpesviral vulvovaginitis: Secondary | ICD-10-CM

## 2024-02-29 DIAGNOSIS — Z13220 Encounter for screening for lipoid disorders: Secondary | ICD-10-CM

## 2024-02-29 DIAGNOSIS — Z13228 Encounter for screening for other metabolic disorders: Secondary | ICD-10-CM

## 2024-02-29 DIAGNOSIS — H60333 Swimmer's ear, bilateral: Secondary | ICD-10-CM | POA: Diagnosis not present

## 2024-02-29 DIAGNOSIS — Z1329 Encounter for screening for other suspected endocrine disorder: Secondary | ICD-10-CM

## 2024-02-29 DIAGNOSIS — Z0000 Encounter for general adult medical examination without abnormal findings: Secondary | ICD-10-CM | POA: Diagnosis not present

## 2024-02-29 DIAGNOSIS — Z130 Encounter for screening for diseases of the blood and blood-forming organs and certain disorders involving the immune mechanism: Secondary | ICD-10-CM

## 2024-02-29 DIAGNOSIS — H6121 Impacted cerumen, right ear: Secondary | ICD-10-CM | POA: Diagnosis not present

## 2024-02-29 MED ORDER — VALACYCLOVIR HCL 500 MG PO TABS
500.0000 mg | ORAL_TABLET | Freq: Two times a day (BID) | ORAL | 0 refills | Status: DC
  Filled 2024-02-29 – 2024-03-24 (×2): qty 30, 15d supply, fill #0

## 2024-02-29 NOTE — Progress Notes (Signed)
 Established Patient Office Visit  Subjective    Patient ID: Karen Mcbride, female    DOB: [birth year]  Age: 33 y.o. MRN: [id number]  CC:  Chief Complaint  Patient presents with   Annual Exam    HPI Conception  Karen Mcbride presents for routine annual exam. She is under care of consultant.  for her decreased hearing and it is improved.  Outpatient Encounter Medications as of 02/29/2024  Medication Sig   ciprofloxacin  (CIPRO ) 500 MG tablet Take 1 tablet (500 mg total) by mouth 2 (two) times daily for 14 days.   ciprofloxacin -dexamethasone  (CIPRODEX ) OTIC suspension Place 4 drops into the affected ear 2 (two) times daily for 10 days.   Iron , Ferrous Sulfate , 325 (65 Fe) MG TABS Take 325 mg by mouth daily.   ketorolac  (TORADOL ) 10 MG tablet Take 1 tablet (10 mg total) by mouth every 6 (six) hours as needed (pain).   ofloxacin  (FLOXIN ) 0.3 % OTIC solution Place 10 drops into both ears daily for 7 days.   predniSONE  (STERAPRED UNI-PAK 21 TAB) 10 MG (21) TBPK tablet Take 6 tablets (60 mg total) by mouth daily for 1 day, THEN 5 tablets (50 mg total) daily for 1 day, THEN 4 tablets (40 mg total) daily for 1 day, THEN 3 tablets (30 mg total) daily for 1 day, THEN 2 tablets (20 mg total) daily for 1 day, THEN 1 tablet (10 mg total) daily for 1 day.   SUMAtriptan (IMITREX) 50 MG tablet Take by mouth.   valACYclovir  (VALTREX ) 500 MG tablet Take 1 tablet (500 mg total) by mouth 2 (two) times daily.   Vitamin D , Ergocalciferol , (DRISDOL ) 1.25 MG (50000 UNIT) CAPS capsule Take 1 capsule (50,000 Units total) by mouth every 7 (seven) days.   No facility-administered encounter medications on file as of 02/29/2024.    Past Medical History:  Diagnosis Date   Depression    Genital herpes     Past Surgical History:  Procedure Laterality Date   TONSILLECTOMY      Family History  Problem Relation Age of Onset   Diabetes Maternal Aunt    Diabetes Maternal Grandfather    Diverticulosis Mother      Social History   Socioeconomic History   Marital status: Single    Spouse name: Not on file   Number of children: Not on file   Years of education: Not on file   Highest education level: 12th grade  Occupational History   Not on file  Tobacco Use   Smoking status: Every Day    Current packs/day: 0.00    Average packs/day: 0.3 packs/day for 4.0 years (1.0 ttl pk-yrs)    Types: Cigarettes    Start date: 11/30/2012    Last attempt to quit: 11/30/2016    Years since quitting: 7.2   Smokeless tobacco: Never  Substance and Sexual Activity   Alcohol use: Yes    Comment: occasion   Drug use: No   Sexual activity: Yes    Birth control/protection: None  Other Topics Concern   Not on file  Social History Narrative   Not on file   Social Drivers of Health   Financial Resource Strain: Medium Risk (02/20/2024)   Overall Financial Resource Strain (CARDIA)    Difficulty of Paying Living Expenses: Somewhat hard  Food Insecurity: Food Insecurity Present (02/20/2024)   Hunger Vital Sign    Worried About Running Out of Food in the Last Year: Sometimes true    Ran Out of  Food in the Last Year: Sometimes true  Transportation Needs: No Transportation Needs (02/20/2024)   PRAPARE - Administrator, Civil Service (Medical): No    Lack of Transportation (Non-Medical): No  Physical Activity: Insufficiently Active (02/20/2024)   Exercise Vital Sign    Days of Exercise per Week: 5 days    Minutes of Exercise per Session: 20 min  Stress: No Stress Concern Present (02/20/2024)   [facility] [employer] - Occupational Stress Questionnaire    Feeling of Stress: Only a little  Social Connections: Unknown (02/20/2024)   Social Connection and Isolation Panel    Frequency of Communication with Friends and Family: Twice a week    Frequency of Social Gatherings with Friends and Family: Patient declined    Attends Religious Services: Patient declined    Database administrator  or Organizations: No    Attends Engineer, structural: Not on file    Marital Status: Never married  Intimate Partner Violence: Not At Risk (03/08/2023)   Humiliation, Afraid, Rape, and Kick questionnaire    Fear of Current or Ex-Partner: No    Emotionally Abused: No    Physically Abused: No    Sexually Abused: No    Review of Systems  All other systems reviewed and are negative.       Objective    BP 133/89   Pulse 84   Ht 5' 3 (1.6 m)   Wt 183 lb 6.4 oz (83.2 kg)   LMP 02/10/2024 (Approximate) Comment: negative HCG 02/20/24  SpO2 98%   BMI 32.49 kg/m   Physical Exam Vitals and nursing note reviewed.  Constitutional:      General: She is not in acute distress. HENT:     Head: Normocephalic and atraumatic.     Right Ear: Tympanic membrane, ear canal and external ear normal.     Left Ear: Tympanic membrane, ear canal and external ear normal.     Nose: Nose normal.     Mouth/Throat:     Mouth: Mucous membranes are moist.     Pharynx: Oropharynx is clear.  Eyes:     Conjunctiva/sclera: Conjunctivae normal.     Pupils: Pupils are equal, round, and reactive to light.  Neck:     Thyroid: No thyromegaly.  Cardiovascular:     Rate and Rhythm: Normal rate and regular rhythm.     Heart sounds: Normal heart sounds. No murmur heard. Pulmonary:     Effort: Pulmonary effort is normal. No respiratory distress.     Breath sounds: Normal breath sounds.  Abdominal:     General: There is no distension.     Palpations: Abdomen is soft. There is no mass.     Tenderness: There is no abdominal tenderness.  Musculoskeletal:        General: Normal range of motion.     Cervical back: Normal range of motion and neck supple.  Skin:    General: Skin is warm and dry.  Neurological:     General: No focal deficit present.     Mental Status: She is alert and oriented to person, place, and time.  Psychiatric:        Mood and Affect: Mood normal.        Behavior: Behavior normal.          Assessment & Plan:   Annual physical exam -     Comprehensive metabolic panel with GFR  Screening for deficiency anemia -  CBC with Differential/Platelet  Screening for lipid disorders -     Lipid panel  Screening for endocrine/metabolic/immunity disorders -     Hemoglobin A1c     No follow-ups on file.   Tanda Raguel SQUIBB, MD

## 2024-03-01 LAB — COMPREHENSIVE METABOLIC PANEL WITH GFR
ALT: 10 IU/L (ref 0–32)
AST: 13 IU/L (ref 0–40)
Albumin: 4.4 g/dL (ref 3.9–4.9)
Alkaline Phosphatase: 67 IU/L (ref 44–121)
BUN/Creatinine Ratio: 16 (ref 9–23)
BUN: 12 mg/dL (ref 6–20)
Bilirubin Total: <0.2 mg/dL (ref 0.0–1.2)
CO2: 22 mmol/L (ref 20–29)
Calcium: 9.3 mg/dL (ref 8.7–10.2)
Chloride: 99 mmol/L (ref 96–106)
Creatinine, Ser: 0.77 mg/dL (ref 0.57–1.00)
Globulin, Total: 2.7 g/dL (ref 1.5–4.5)
Glucose: 74 mg/dL (ref 70–99)
Potassium: 4.5 mmol/L (ref 3.5–5.2)
Sodium: 140 mmol/L (ref 134–144)
Total Protein: 7.1 g/dL (ref 6.0–8.5)
eGFR: 104 mL/min/1.73 (ref >59)

## 2024-03-01 LAB — CBC WITH DIFFERENTIAL/PLATELET
Basophils Absolute: 0 x10E3/uL (ref 0.0–0.2)
Basos: 0 %
EOS (ABSOLUTE): 0 x10E3/uL (ref 0.0–0.4)
Eos: 0 %
Hematocrit: 37.4 % (ref 34.0–46.6)
Hemoglobin: 12.3 g/dL (ref 11.1–15.9)
Immature Grans (Abs): 0.1 x10E3/uL (ref 0.0–0.1)
Immature Granulocytes: 1 %
Lymphocytes Absolute: 2.4 x10E3/uL (ref 0.7–3.1)
Lymphs: 19 %
MCH: 30.1 pg (ref 26.6–33.0)
MCHC: 32.9 g/dL (ref 31.5–35.7)
MCV: 91 fL (ref 79–97)
Monocytes Absolute: 0.6 x10E3/uL (ref 0.1–0.9)
Monocytes: 5 %
Neutrophils Absolute: 9.3 x10E3/uL — ABNORMAL HIGH (ref 1.4–7.0)
Neutrophils: 75 %
Platelets: 367 x10E3/uL (ref 150–450)
RBC: 4.09 x10E6/uL (ref 3.77–5.28)
RDW: 13.5 % (ref 11.7–15.4)
WBC: 12.3 x10E3/uL — ABNORMAL HIGH (ref 3.4–10.8)

## 2024-03-01 LAB — LIPID PANEL
Chol/HDL Ratio: 5.6 ratio — ABNORMAL HIGH (ref 0.0–4.4)
Cholesterol, Total: 214 mg/dL — ABNORMAL HIGH (ref 100–199)
HDL: 38 mg/dL — ABNORMAL LOW (ref >39)
LDL Chol Calc (NIH): 161 mg/dL — ABNORMAL HIGH (ref 0–99)
Triglycerides: 84 mg/dL (ref 0–149)
VLDL Cholesterol Cal: 15 mg/dL (ref 5–40)

## 2024-03-01 LAB — HEMOGLOBIN A1C
Est. average glucose Bld gHb Est-mCnc: 117 mg/dL
Hgb A1c MFr Bld: 5.7 % — ABNORMAL HIGH (ref 4.8–5.6)

## 2024-03-04 ENCOUNTER — Other Ambulatory Visit (HOSPITAL_COMMUNITY): Payer: Self-pay

## 2024-03-07 ENCOUNTER — Ambulatory Visit: Payer: Self-pay | Admitting: Family Medicine

## 2024-03-11 ENCOUNTER — Other Ambulatory Visit (HOSPITAL_COMMUNITY): Payer: Self-pay

## 2024-03-13 DIAGNOSIS — H60333 Swimmer's ear, bilateral: Secondary | ICD-10-CM | POA: Diagnosis not present

## 2024-03-24 ENCOUNTER — Other Ambulatory Visit (HOSPITAL_COMMUNITY): Payer: Self-pay

## 2024-04-09 ENCOUNTER — Other Ambulatory Visit: Payer: Self-pay | Admitting: Family Medicine

## 2024-04-09 ENCOUNTER — Other Ambulatory Visit (HOSPITAL_COMMUNITY): Payer: Self-pay

## 2024-04-09 DIAGNOSIS — A6004 Herpesviral vulvovaginitis: Secondary | ICD-10-CM

## 2024-04-09 MED ORDER — VALACYCLOVIR HCL 500 MG PO TABS
500.0000 mg | ORAL_TABLET | Freq: Two times a day (BID) | ORAL | 0 refills | Status: DC
  Filled 2024-04-09: qty 30, 15d supply, fill #0

## 2024-04-10 ENCOUNTER — Institutional Professional Consult (permissible substitution) (INDEPENDENT_AMBULATORY_CARE_PROVIDER_SITE_OTHER)

## 2024-04-10 ENCOUNTER — Ambulatory Visit (INDEPENDENT_AMBULATORY_CARE_PROVIDER_SITE_OTHER): Admitting: Audiology

## 2024-04-12 ENCOUNTER — Telehealth: Admitting: Family Medicine

## 2024-04-12 DIAGNOSIS — B3731 Acute candidiasis of vulva and vagina: Secondary | ICD-10-CM

## 2024-04-12 MED ORDER — FLUCONAZOLE 150 MG PO TABS
150.0000 mg | ORAL_TABLET | Freq: Every day | ORAL | 0 refills | Status: AC
Start: 2024-04-12 — End: 2024-04-13

## 2024-04-12 NOTE — Progress Notes (Signed)

## 2024-05-15 ENCOUNTER — Other Ambulatory Visit: Payer: Self-pay | Admitting: Family Medicine

## 2024-05-15 DIAGNOSIS — A6004 Herpesviral vulvovaginitis: Secondary | ICD-10-CM

## 2024-05-21 ENCOUNTER — Other Ambulatory Visit (HOSPITAL_COMMUNITY): Payer: Self-pay

## 2024-05-21 ENCOUNTER — Other Ambulatory Visit: Payer: Self-pay | Admitting: Family Medicine

## 2024-05-21 DIAGNOSIS — A6004 Herpesviral vulvovaginitis: Secondary | ICD-10-CM

## 2024-05-21 MED ORDER — VALACYCLOVIR HCL 500 MG PO TABS
500.0000 mg | ORAL_TABLET | Freq: Two times a day (BID) | ORAL | 0 refills | Status: DC
  Filled 2024-05-21: qty 30, 15d supply, fill #0

## 2024-05-21 NOTE — Telephone Encounter (Signed)
 Requested Prescriptions  Pending Prescriptions Disp Refills   valACYclovir  (VALTREX ) 500 MG tablet 30 tablet 0    Sig: Take 1 tablet (500 mg total) by mouth 2 (two) times daily.     Antimicrobials:  Antiviral Agents - Anti-Herpetic Passed - 05/21/2024 10:12 AM      Passed - Valid encounter within last 12 months    Recent Outpatient Visits           2 months ago Annual physical exam   [facility], MD   3 months ago Otalgia of both ears   [facility], MD   1 year ago Encounter for smoking cessation counseling   [facility], MD   1 year ago Annual physical exam   [facility], MD   2 years ago Annual physical exam   [facility], MD

## 2024-06-30 ENCOUNTER — Other Ambulatory Visit: Payer: Self-pay | Admitting: Family Medicine

## 2024-06-30 ENCOUNTER — Other Ambulatory Visit (HOSPITAL_COMMUNITY): Payer: Self-pay

## 2024-06-30 ENCOUNTER — Telehealth: Admitting: Physician Assistant

## 2024-06-30 DIAGNOSIS — M5442 Lumbago with sciatica, left side: Secondary | ICD-10-CM

## 2024-06-30 DIAGNOSIS — A6004 Herpesviral vulvovaginitis: Secondary | ICD-10-CM

## 2024-06-30 DIAGNOSIS — W19XXXA Unspecified fall, initial encounter: Secondary | ICD-10-CM | POA: Diagnosis not present

## 2024-06-30 MED ORDER — VALACYCLOVIR HCL 500 MG PO TABS
500.0000 mg | ORAL_TABLET | Freq: Two times a day (BID) | ORAL | 0 refills | Status: AC
  Filled 2024-06-30: qty 90, 45d supply, fill #0

## 2024-06-30 MED ORDER — NAPROXEN 500 MG PO TABS
500.0000 mg | ORAL_TABLET | Freq: Two times a day (BID) | ORAL | 0 refills | Status: AC
  Filled 2024-06-30: qty 30, 15d supply, fill #0

## 2024-06-30 MED ORDER — CYCLOBENZAPRINE HCL 10 MG PO TABS
5.0000 mg | ORAL_TABLET | Freq: Three times a day (TID) | ORAL | 0 refills | Status: AC | PRN
  Filled 2024-06-30: qty 30, 10d supply, fill #0

## 2024-06-30 NOTE — Progress Notes (Signed)
 We are sorry that you are not feeling well.  Here is how we plan to help!  Based on what you have shared with me it, appears you may be experiencing acute back pain.   Acute back pain is defined as musculoskeletal pain that can resolve in 1-3 weeks with conservative treatment.  I have prescribed a non-steroid anti-inflammatory (NSAID) Naprosyn  500 mg -- take one by mouth twice a day, as well as a muscle relaxant, Flexeril  10 mg every eight hours as needed. Some patients experience stomach irritation or in increased heartburn with anti-inflammatory drugs.  Please keep in mind that muscle relaxer's can cause fatigue and should not be taken while at work or driving.  Back pain is very common.  The pain often gets better over time.  The cause of back pain is usually not dangerous.  Most people can learn to manage their back pain on their own.  Home Care Stay active.  Start with short walks on flat ground if you can.  Try to walk farther each day. Do not sit, drive or stand in one place for more than 30 minutes.  Do not stay in bed. Do not fully avoid exercise or work.  Activity can help your back heal faster. Be careful when you bend or lift an object.  Bend at your knees, keep the object close to you, and do not twist. Sleep on a firm mattress.  Lie on your side, and bend your knees.  If you lie on your back, put a pillow under your knees. Only take medicines as told by your doctor. Put ice on the injured area. Put ice in a plastic bag Place a towel between your skin and the bag Leave the ice on for 15-20 minutes, 3-4 times a day for the first 2-3 days.  After that, you can switch between ice and heat packs. Ask your doctor about back exercises or massage.   Get Help Right Way If: Your pain does not go away with rest and treatments given today. Your pain does not go away within 1 week. You have new problems. You do not feel well. The pain spreads into your legs. You cannot control when you  poop (bowel movement) or pee (urinate). You feel sick to your stomach (nauseous) or throw up (vomit). You have belly (abdominal) pain. You feel like you may pass out (faint). If you develop a fever.  Make Sure you: Understand these instructions. Continue to monitor your condition for any changes. Will get help right away if you are not doing well or get worse.  Your e-visit answers were reviewed by a board certified advanced clinical practitioner to complete your personal care plan.  Depending on the condition, your plan could have included both over the counter or prescription medications.  If there is a problem, please reply once you have received a response from your provider.  Your safety is important to us .  If you have drug allergies, check your prescription carefully.    You can use MyChart to ask questions about todays visit, request a non-urgent call back, or ask for a work or school excuse for 24 hours related to this e-Visit. If it has been greater than 24 hours you will need to follow up with your provider or enter a new e-Visit to address those concerns.  You will get an e-mail in the next two days asking about your experience.  I hope that your e-visit has been valuable and will speed your  recovery. Thank you for using e-visits.   I have spent 5 minutes in review of e-visit questionnaire, review and updating patient chart, medical decision making and response to patient.   Delon CHRISTELLA Dickinson, PA-C

## 2024-07-28 ENCOUNTER — Encounter (HOSPITAL_BASED_OUTPATIENT_CLINIC_OR_DEPARTMENT_OTHER): Payer: Self-pay | Admitting: Emergency Medicine

## 2024-07-28 ENCOUNTER — Emergency Department (HOSPITAL_BASED_OUTPATIENT_CLINIC_OR_DEPARTMENT_OTHER)
Admission: EM | Admit: 2024-07-28 | Discharge: 2024-07-28 | Disposition: A | Discharge status: Home / Self Care | Attending: Emergency Medicine | Admitting: Emergency Medicine

## 2024-07-28 ENCOUNTER — Other Ambulatory Visit: Payer: Self-pay

## 2024-07-28 ENCOUNTER — Telehealth: Admitting: Physician Assistant

## 2024-07-28 DIAGNOSIS — J029 Acute pharyngitis, unspecified: Secondary | ICD-10-CM

## 2024-07-28 LAB — RESP PANEL BY RT-PCR (RSV, FLU A&B, COVID)  RVPGX2
Influenza A by PCR: NEGATIVE
Influenza B by PCR: NEGATIVE
Resp Syncytial Virus by PCR: NEGATIVE
SARS Coronavirus 2 by RT PCR: NEGATIVE

## 2024-07-28 LAB — GROUP A STREP BY PCR: Group A Strep by PCR: NOT DETECTED

## 2024-07-28 MED ORDER — LIDOCAINE VISCOUS HCL 2 % MT SOLN
15.0000 mL | Freq: Once | OROMUCOSAL | Status: AC
  Administered 2024-07-28: 15 mL via OROMUCOSAL
  Filled 2024-07-28: qty 15

## 2024-07-28 MED ORDER — AMOXICILLIN 500 MG PO CAPS: 1000.0000 mg | ORAL_CAPSULE | Freq: Every day | ORAL | 0 refills | Status: AC

## 2024-07-28 MED ORDER — DEXAMETHASONE SOD PHOSPHATE PF 10 MG/ML IJ SOLN
10.0000 mg | Freq: Once | INTRAMUSCULAR | Status: AC
  Administered 2024-07-28: 10 mg via INTRAMUSCULAR
  Filled 2024-07-28: qty 1

## 2024-07-28 NOTE — ED Provider Notes (Signed)
 " [facility] AT [facility] Provider Note   CSN: [id number] Arrival date & time: 07/28/24  2202     Patient presents with: Sore Throat   Karen  L Mcbride is a 34 y.o. female.  {Add pertinent medical, surgical, social history, OB history to HPI:4162} 34 year old female history of strep throat who presents emergency department with throat pain.  Patient reports on Friday had sore throat that was mild.  Says it was getting better and then this morning woke up and hurts significantly to swallow anything.  Had a video visit where she was diagnosed with viral pharyngitis but says that she is has a strep throat 2-3 times a year for many years and it feels exactly like that.  Chills but no known fevers.  No difficulty breathing.  Has had a tonsillectomy for this       Prior to Admission medications  Medication Sig Start Date End Date Taking? Authorizing Provider  ciprofloxacin  (CIPRO ) 500 MG tablet Take 1 tablet (500 mg total) by mouth 2 (two) times daily for 14 days. 02/27/24     ciprofloxacin -dexamethasone  (CIPRODEX ) OTIC suspension Place 4 drops into the affected ear 2 (two) times daily for 10 days. 02/27/24     cyclobenzaprine  (FLEXERIL ) 10 MG tablet Take 1/2-1 tablet (5-10 mg total) by mouth 3 (three) times daily as needed. 06/30/24   Vivienne Delon HERO, PA-C  Iron , Ferrous Sulfate , 325 (65 Fe) MG TABS Take 325 mg by mouth daily. 03/20/23   Tanda Bleacher, MD  ketorolac  (TORADOL ) 10 MG tablet Take 1 tablet (10 mg total) by mouth every 6 (six) hours as needed (pain). 02/18/24   Vonna Sharlet POUR, MD  naproxen  (NAPROSYN ) 500 MG tablet Take 1 tablet (500 mg total) by mouth 2 (two) times daily with a meal. 06/30/24   Burnette, Delon HERO, PA-C  SUMAtriptan (IMITREX) 50 MG tablet Take by mouth. 02/01/23   [provider]  valACYclovir  (VALTREX ) 500 MG tablet Take 1 tablet (500 mg total) by mouth 2 (two) times daily. 06/30/24   Jaycee Greig PARAS, NP  Vitamin D ,  Ergocalciferol , (DRISDOL ) 1.25 MG (50000 UNIT) CAPS capsule Take 1 capsule (50,000 Units total) by mouth every 7 (seven) days. 03/09/23   Tanda Bleacher, MD    Allergies: Patient has no known allergies.    Review of Systems  Updated Vital Signs BP (!) 165/98   Pulse (!) 116   Temp 99.2 F (37.3 C) (Oral)   Resp 18   Ht 5' 3 (1.6 m)   Wt 83.9 kg   SpO2 98%   BMI 32.77 kg/m   Physical Exam Vitals and nursing note reviewed.  Constitutional:      General: She is not in acute distress.    Appearance: She is well-developed.  HENT:     Head: Normocephalic and atraumatic.     Right Ear: External ear normal.     Left Ear: External ear normal.     Nose: Nose normal.     Mouth/Throat:     Pharynx: Posterior oropharyngeal erythema present. No oropharyngeal exudate.  Eyes:     Extraocular Movements: Extraocular movements intact.     Conjunctiva/sclera: Conjunctivae normal.     Pupils: Pupils are equal, round, and reactive to light.  Cardiovascular:     Rate and Rhythm: Normal rate and regular rhythm.     Heart sounds: No murmur heard. Pulmonary:     Effort: Pulmonary effort is normal. No respiratory distress.     Breath  sounds: Normal breath sounds.  Musculoskeletal:     Cervical back: Normal range of motion and neck supple.  Lymphadenopathy:     Cervical: Cervical adenopathy present.  Skin:    General: Skin is warm and dry.  Neurological:     Mental Status: She is alert.     (all labs ordered are listed, but only abnormal results are displayed) Labs Reviewed  RESP PANEL BY RT-PCR (RSV, FLU A&B, COVID)  [id number]  GROUP A STREP BY PCR    EKG: None  Radiology: No results found.  {Document cardiac monitor, telemetry assessment procedure when appropriate:32947} Procedures   Medications Ordered in the ED - No data to display    {Click here for ABCD2, HEART and other calculators REFRESH Note before signing:1}                              Medical Decision  Making Risk Prescription drug management.   ***  {Document critical care time when appropriate  Document review of labs and clinical decision tools ie CHADS2VASC2, etc  Document your independent review of radiology images and any outside records  Document your discussion with family members, caretakers and with consultants  Document social determinants of health affecting pt's care  Document your decision making why or why not admission, treatments were needed:32947:::1}   Final diagnoses:  None    ED Discharge Orders     None        "

## 2024-07-28 NOTE — ED Triage Notes (Signed)
 Sore throat since Friday night this has progressed since than and now she's having trouble swallowing. Denies fever. Poor appetite, emesis today.

## 2024-07-28 NOTE — Discharge Instructions (Signed)
 You were seen for sore throat (pharyngitis) in the emergency department.   At home, please take Tylenol  and ibuprofen  for your pain.    Check your MyChart for the results of your throat culture.  If it is positive for strep throat you may fill the prescription that we have given you for amoxicillin .  Follow-up with your primary doctor in 2-3 days regarding your visit.    Return immediately to the emergency department if you experience any of the following: Difficulty breathing, or any other concerning symptoms.    Thank you for visiting our Emergency Department. It was a pleasure taking care of you today.

## 2024-07-28 NOTE — Progress Notes (Signed)
 We are sorry that you are not feeling well.  Here is how we plan to help!  Your symptoms indicate a likely viral infection (Pharyngitis).   Pharyngitis is inflammation in the back of the throat which can cause a sore throat, scratchiness and sometimes difficulty swallowing.   Pharyngitis is typically caused by a respiratory virus and will just run its course.  Please keep in mind that your symptoms could last up to 10 days.    For throat pain, we recommend over the counter oral pain relief medications such as acetaminophen  or aspirin, or anti-inflammatory medications such as ibuprofen  or naproxen sodium.  Topical treatments such as oral throat lozenges or sprays may be used as needed.   Avoid close contact with loved ones, especially the very young and elderly.  Remember to wash your hands thoroughly throughout the day as this is the number one way to prevent the spread of infection! We also recommend that you periodically wipe down door knobs and counters with disinfectant.  After careful review of your answers, I would not recommend and antibiotic for your condition.  Antibiotics should not be used to treat conditions that we suspect are caused by viruses like the virus that causes the common cold or flu.  Providers prescribe antibiotics to treat infections caused by bacteria. Antibiotics are very powerful in treating bacterial infections when they are used properly.  To maintain their effectiveness, they should be used only when necessary.  Overuse of antibiotics has resulted in the development of super bugs that are resistant to treatment!    Some people with strep throat, however, can have atypical symptoms. As such, if anything continued to progress despite treatment recommendations, you may need formal testing in clinic or office.  Home Care: Only take medications as instructed by your medical team. Do not drink alcohol while taking these medications. A steam or ultrasonic humidifier can help  congestion.  You can place a towel over your head and breathe in the steam from hot water coming from a faucet. Avoid close contacts especially the very young and the elderly. Cover your mouth when you cough or sneeze. Always remember to wash your hands.  Get Help Right Away If: You develop worsening fever or throat pain. You develop a severe head ache or visual changes. Your symptoms persist after you have completed your treatment plan.  Make sure you Understand these instructions. Will watch your condition. Will get help right away if you are not doing well or get worse.  Your e-visit answers were reviewed by a board certified advanced clinical practitioner to complete your personal care plan.  Depending on the condition, your plan could have included both over the counter or prescription medications.  If there is a problem please reply once you have received a response from your provider.  Your safety is important to us .  If you have drug allergies check your prescription carefully.    You can use MyChart to ask questions about todays visit, request a non-urgent call back, or ask for a work or school excuse for 24 hours related to this e-Visit. If it has been greater than 24 hours you will need to follow up with your provider, or enter a new e-Visit to address those concerns.  You will get an e-mail in the next two days asking about your experience.  I hope that your e-visit has been valuable and will speed your recovery. Thank you for using e-visits.   I have spent 5 minutes  in review of e-visit questionnaire, review and updating patient chart, medical decision making and response to patient.   Elsie Velma Lunger, PA-C

## 2024-07-31 LAB — CULTURE, GROUP A STREP (THRC)

## 2025-03-02 ENCOUNTER — Encounter: Admitting: Family Medicine
